# Patient Record
Sex: Female | Born: 1940 | Race: White | Hispanic: No | Smoking: Former smoker
Health system: Southern US, Community
[De-identification: ages and names within clinical notes are randomized; demographics above are authoritative.]

## PROBLEM LIST (undated history)

## (undated) DIAGNOSIS — K635 Polyp of colon: Secondary | ICD-10-CM

## (undated) DIAGNOSIS — K219 Gastro-esophageal reflux disease without esophagitis: Secondary | ICD-10-CM

## (undated) DIAGNOSIS — M1990 Unspecified osteoarthritis, unspecified site: Secondary | ICD-10-CM

## (undated) DIAGNOSIS — K222 Esophageal obstruction: Secondary | ICD-10-CM

## (undated) DIAGNOSIS — I10 Essential (primary) hypertension: Secondary | ICD-10-CM

## (undated) DIAGNOSIS — K5790 Diverticulosis of intestine, part unspecified, without perforation or abscess without bleeding: Secondary | ICD-10-CM

## (undated) DIAGNOSIS — K759 Inflammatory liver disease, unspecified: Secondary | ICD-10-CM

## (undated) DIAGNOSIS — E7800 Pure hypercholesterolemia, unspecified: Secondary | ICD-10-CM

## (undated) HISTORY — PX: ABDOMINAL HYSTERECTOMY: SHX81

## (undated) HISTORY — PX: CATARACT EXTRACTION: SUR2

---

## 2004-10-31 ENCOUNTER — Ambulatory Visit: Payer: Self-pay | Admitting: Family Medicine

## 2005-04-14 ENCOUNTER — Ambulatory Visit: Payer: Self-pay | Admitting: Family Medicine

## 2006-04-23 ENCOUNTER — Ambulatory Visit: Payer: Self-pay | Admitting: Family Medicine

## 2007-06-23 ENCOUNTER — Ambulatory Visit: Payer: Self-pay | Admitting: Family Medicine

## 2008-02-16 ENCOUNTER — Ambulatory Visit: Payer: Self-pay | Admitting: Gastroenterology

## 2008-07-18 ENCOUNTER — Ambulatory Visit: Payer: Self-pay | Admitting: Family Medicine

## 2009-07-24 ENCOUNTER — Ambulatory Visit: Payer: Self-pay | Admitting: Family Medicine

## 2009-08-14 ENCOUNTER — Ambulatory Visit: Payer: Self-pay | Admitting: Family Medicine

## 2009-10-22 ENCOUNTER — Ambulatory Visit: Payer: Self-pay | Admitting: Gastroenterology

## 2010-02-19 ENCOUNTER — Ambulatory Visit: Payer: Self-pay | Admitting: Family Medicine

## 2011-01-27 ENCOUNTER — Ambulatory Visit: Payer: Self-pay | Admitting: Family Medicine

## 2012-05-03 ENCOUNTER — Ambulatory Visit: Payer: Self-pay | Admitting: Family Medicine

## 2013-09-11 ENCOUNTER — Ambulatory Visit: Payer: Self-pay | Admitting: Family Medicine

## 2014-09-26 ENCOUNTER — Ambulatory Visit: Payer: Self-pay | Admitting: Family Medicine

## 2014-10-19 ENCOUNTER — Ambulatory Visit: Payer: Self-pay | Admitting: Gastroenterology

## 2015-06-04 ENCOUNTER — Other Ambulatory Visit: Payer: Self-pay | Admitting: Family Medicine

## 2015-06-04 DIAGNOSIS — Z1231 Encounter for screening mammogram for malignant neoplasm of breast: Secondary | ICD-10-CM

## 2015-10-10 ENCOUNTER — Ambulatory Visit
Admission: RE | Admit: 2015-10-10 | Discharge: 2015-10-10 | Disposition: A | Discharge status: Home / Self Care | Payer: Medicare Other | Source: Ambulatory Visit | Attending: Family Medicine | Admitting: Family Medicine

## 2015-10-10 ENCOUNTER — Other Ambulatory Visit: Payer: Self-pay | Admitting: Family Medicine

## 2015-10-10 DIAGNOSIS — Z1231 Encounter for screening mammogram for malignant neoplasm of breast: Secondary | ICD-10-CM | POA: Diagnosis present

## 2015-11-27 ENCOUNTER — Other Ambulatory Visit: Payer: Self-pay | Admitting: Surgery

## 2015-11-27 DIAGNOSIS — M1712 Unilateral primary osteoarthritis, left knee: Secondary | ICD-10-CM

## 2015-12-20 ENCOUNTER — Ambulatory Visit
Admission: RE | Admit: 2015-12-20 | Discharge: 2015-12-20 | Disposition: A | Discharge status: Home / Self Care | Payer: Medicare Other | Source: Ambulatory Visit | Attending: Surgery | Admitting: Surgery

## 2015-12-20 DIAGNOSIS — M1712 Unilateral primary osteoarthritis, left knee: Secondary | ICD-10-CM

## 2015-12-20 DIAGNOSIS — M25462 Effusion, left knee: Secondary | ICD-10-CM | POA: Insufficient documentation

## 2015-12-20 DIAGNOSIS — M659 Synovitis and tenosynovitis, unspecified: Secondary | ICD-10-CM | POA: Diagnosis not present

## 2016-01-15 ENCOUNTER — Ambulatory Visit
Admission: RE | Admit: 2016-01-15 | Discharge: 2016-01-15 | Disposition: A | Discharge status: Home / Self Care | Payer: Medicare Other | Source: Ambulatory Visit | Attending: Surgery | Admitting: Surgery

## 2016-01-15 ENCOUNTER — Encounter
Admission: RE | Admit: 2016-01-15 | Discharge: 2016-01-15 | Disposition: A | Discharge status: Home / Self Care | Payer: Medicare Other | Source: Ambulatory Visit | Attending: Surgery | Admitting: Surgery

## 2016-01-15 ENCOUNTER — Other Ambulatory Visit: Payer: Medicare Other

## 2016-01-15 DIAGNOSIS — I10 Essential (primary) hypertension: Secondary | ICD-10-CM | POA: Insufficient documentation

## 2016-01-15 DIAGNOSIS — Z87891 Personal history of nicotine dependence: Secondary | ICD-10-CM | POA: Diagnosis not present

## 2016-01-15 DIAGNOSIS — R918 Other nonspecific abnormal finding of lung field: Secondary | ICD-10-CM | POA: Insufficient documentation

## 2016-01-15 DIAGNOSIS — Z01818 Encounter for other preprocedural examination: Secondary | ICD-10-CM | POA: Insufficient documentation

## 2016-01-15 HISTORY — DX: Gastro-esophageal reflux disease without esophagitis: K21.9

## 2016-01-15 HISTORY — DX: Essential (primary) hypertension: I10

## 2016-01-15 HISTORY — DX: Pure hypercholesterolemia, unspecified: E78.00

## 2016-01-15 HISTORY — DX: Unspecified osteoarthritis, unspecified site: M19.90

## 2016-01-15 HISTORY — DX: Inflammatory liver disease, unspecified: K75.9

## 2016-01-15 LAB — CBC
HCT: 42 % (ref 35.0–47.0)
Hemoglobin: 14.1 g/dL (ref 12.0–16.0)
MCH: 31.6 pg (ref 26.0–34.0)
MCHC: 33.6 g/dL (ref 32.0–36.0)
MCV: 94 fL (ref 80.0–100.0)
PLATELETS: 180 10*3/uL (ref 150–440)
RBC: 4.47 MIL/uL (ref 3.80–5.20)
RDW: 12.8 % (ref 11.5–14.5)
WBC: 5.4 10*3/uL (ref 3.6–11.0)

## 2016-01-15 LAB — URINALYSIS COMPLETE WITH MICROSCOPIC (ARMC ONLY)
BACTERIA UA: NONE SEEN
Bilirubin Urine: NEGATIVE
GLUCOSE, UA: NEGATIVE mg/dL
HGB URINE DIPSTICK: NEGATIVE
KETONES UR: NEGATIVE mg/dL
NITRITE: NEGATIVE
PROTEIN: NEGATIVE mg/dL
SPECIFIC GRAVITY, URINE: 1.015 (ref 1.005–1.030)
pH: 5 (ref 5.0–8.0)

## 2016-01-15 LAB — BASIC METABOLIC PANEL
Anion gap: 7 (ref 5–15)
BUN: 16 mg/dL (ref 6–20)
CO2: 32 mmol/L (ref 22–32)
CREATININE: 0.72 mg/dL (ref 0.44–1.00)
Calcium: 9.7 mg/dL (ref 8.9–10.3)
Chloride: 101 mmol/L (ref 101–111)
GFR calc Af Amer: >60 mL/min (ref >60)
GLUCOSE: 94 mg/dL (ref 65–99)
Potassium: 3.1 mmol/L — ABNORMAL LOW (ref 3.5–5.1)
SODIUM: 140 mmol/L (ref 135–145)

## 2016-01-15 LAB — TYPE AND SCREEN
ABO/RH(D): A POS
ANTIBODY SCREEN: NEGATIVE

## 2016-01-15 LAB — SURGICAL PCR SCREEN
MRSA, PCR: NEGATIVE
Staphylococcus aureus: NEGATIVE

## 2016-01-15 LAB — ABO/RH: ABO/RH(D): A POS

## 2016-01-15 LAB — PROTIME-INR
INR: 0.93
PROTHROMBIN TIME: 12.7 s (ref 11.4–15.0)

## 2016-01-15 NOTE — Patient Instructions (Addendum)
  Your procedure is scheduled on: January 23, 2016 (Thursday)  Report to Day Surgery.[facility]) Second Floor To find out your arrival time please call [phone] between 1PM - 3PM on January 22, 2016 (Wednesday).  Remember: Instructions that are not followed completely may result in serious medical risk, up to and including death, or upon the discretion of your surgeon and anesthesiologist your surgery may need to be rescheduled.    __x__ 1. Do not eat food or drink liquids after midnight. No gum chewing or hard candies.     ____ 2. No Alcohol for 24 hours before or after surgery.   ____ 3. Bring all medications with you on the day of surgery if instructed.    __x__ 4. Notify your doctor if there is any change in your medical condition     (cold, fever, infections).     Do not wear jewelry, make-up, hairpins, clips or nail polish.  Do not wear lotions, powders, or perfumes. You may wear deodorant.  Do not shave 48 hours prior to surgery. Men may shave face and neck.  Do not bring valuables to the hospital.    [facility] is not responsible for any belongings or valuables.               Contacts, dentures or bridgework may not be worn into surgery.  Leave your suitcase in the car. After surgery it may be brought to your room.  For patients admitted to the hospital, discharge time is determined by your                treatment team.   Patients discharged the day of surgery will not be allowed to drive home.   Please read over the following fact sheets that you were given:   MRSA Information and Surgical Site Infection Prevention   ____ Take these medicines the morning of surgery with A SIP OF WATER:    1. Atenolol  2. Amlodipine  3. Benazepril  4.Simvastatin  5.Pantoprazole (Pantoprazole at bedtime on January 25)  6.  ____ Fleet Enema (as directed)   __x__ Use CHG Soap as directed  ____ Use inhalers on the day of surgery  ____ Stop metformin 2 days prior to  surgery    ____ Take 1/2 of usual insulin dose the night before surgery and none on the morning of surgery.   _x___ Stop Coumadin/Plavix/aspirin on (STOP ASPIRIN ONE WEEK PRIOR TO SURGERY)  _x___ Stop Anti-inflammatories on (NO NSAIDS, TYLENOL OK TO TAKE FOR PAIN IF NEEDED)   _X___ Stop supplements until after surgery. (STOP MULTIVITAMIN NOW)  ____ Bring C-Pap to the hospital.

## 2016-01-15 NOTE — Pre-Procedure Instructions (Signed)
Called Dr Marcello Moores with EKG interpretation." Sounds OK to proceed."

## 2016-01-16 NOTE — Pre-Procedure Instructions (Signed)
Potassium and Ua results , CXR, called and faxed to [facility] @ Dr. Roland Rack office.

## 2016-01-23 ENCOUNTER — Inpatient Hospital Stay
Admission: RE | Admit: 2016-01-23 | Discharge: 2016-01-25 | DRG: 470 | Disposition: A | Discharge status: Home / Self Care | Payer: Medicare Other | Source: Ambulatory Visit | Attending: Surgery | Admitting: Surgery

## 2016-01-23 ENCOUNTER — Ambulatory Visit: Payer: Medicare Other | Admitting: Anesthesiology

## 2016-01-23 ENCOUNTER — Encounter
Admission: RE | Disposition: A | Discharge status: Home / Self Care | Payer: Self-pay | Source: Ambulatory Visit | Attending: Surgery

## 2016-01-23 ENCOUNTER — Inpatient Hospital Stay: Payer: Medicare Other

## 2016-01-23 DIAGNOSIS — I10 Essential (primary) hypertension: Secondary | ICD-10-CM | POA: Diagnosis present

## 2016-01-23 DIAGNOSIS — E7800 Pure hypercholesterolemia, unspecified: Secondary | ICD-10-CM | POA: Diagnosis present

## 2016-01-23 DIAGNOSIS — M1712 Unilateral primary osteoarthritis, left knee: Principal | ICD-10-CM | POA: Diagnosis present

## 2016-01-23 DIAGNOSIS — Z7982 Long term (current) use of aspirin: Secondary | ICD-10-CM | POA: Diagnosis not present

## 2016-01-23 DIAGNOSIS — M25762 Osteophyte, left knee: Secondary | ICD-10-CM | POA: Diagnosis present

## 2016-01-23 DIAGNOSIS — Z87891 Personal history of nicotine dependence: Secondary | ICD-10-CM

## 2016-01-23 DIAGNOSIS — K219 Gastro-esophageal reflux disease without esophagitis: Secondary | ICD-10-CM | POA: Diagnosis present

## 2016-01-23 DIAGNOSIS — Z96652 Presence of left artificial knee joint: Secondary | ICD-10-CM

## 2016-01-23 HISTORY — PX: PARTIAL KNEE ARTHROPLASTY: SHX2174

## 2016-01-23 LAB — POTASSIUM: Potassium: 4.3 mmol/L (ref 3.5–5.1)

## 2016-01-23 LAB — POCT I-STAT 4, (NA,K, GLUC, HGB,HCT)
Glucose, Bld: 98 mg/dL (ref 65–99)
HCT: 46 % (ref 36.0–46.0)
HEMOGLOBIN: 15.6 g/dL — AB (ref 12.0–15.0)
Potassium: 4.7 mmol/L (ref 3.5–5.1)
Sodium: 140 mmol/L (ref 135–145)

## 2016-01-23 SURGERY — ARTHROPLASTY, KNEE, UNICOMPARTMENTAL
Anesthesia: General | Site: Knee | Laterality: Left | Wound class: Clean

## 2016-01-23 MED ORDER — SODIUM CHLORIDE 0.9 % IJ SOLN
INTRAMUSCULAR | Status: AC
  Filled 2016-01-23: qty 50

## 2016-01-23 MED ORDER — FENTANYL CITRATE (PF) 100 MCG/2ML IJ SOLN: 25.0000 ug | INTRAMUSCULAR | Status: DC | PRN

## 2016-01-23 MED ORDER — KETAMINE HCL 50 MG/ML IJ SOLN
INTRAMUSCULAR | Status: DC | PRN
  Administered 2016-01-23: 1.15 mg via INTRAMUSCULAR
  Administered 2016-01-23: 2.3 mg via INTRAMUSCULAR

## 2016-01-23 MED ORDER — SIMVASTATIN 20 MG PO TABS
20.0000 mg | ORAL_TABLET | Freq: Every day | ORAL | Status: DC
  Administered 2016-01-24 – 2016-01-25 (×2): 20 mg via ORAL
  Filled 2016-01-23 (×2): qty 1

## 2016-01-23 MED ORDER — ACETAMINOPHEN 325 MG PO TABS
650.0000 mg | ORAL_TABLET | Freq: Four times a day (QID) | ORAL | Status: DC | PRN
  Administered 2016-01-24: 650 mg via ORAL
  Filled 2016-01-23: qty 2

## 2016-01-23 MED ORDER — DIPHENHYDRAMINE HCL 12.5 MG/5ML PO ELIX
12.5000 mg | ORAL_SOLUTION | ORAL | Status: DC | PRN
  Administered 2016-01-23: 25 mg via ORAL
  Filled 2016-01-23: qty 10

## 2016-01-23 MED ORDER — ACETAMINOPHEN 650 MG RE SUPP: 650.0000 mg | Freq: Four times a day (QID) | RECTAL | Status: DC | PRN

## 2016-01-23 MED ORDER — MAGNESIUM HYDROXIDE 400 MG/5ML PO SUSP
30.0000 mL | Freq: Every day | ORAL | Status: DC | PRN
  Administered 2016-01-24 – 2016-01-25 (×2): 30 mL via ORAL
  Filled 2016-01-23 (×2): qty 30

## 2016-01-23 MED ORDER — GLYCOPYRROLATE 0.2 MG/ML IJ SOLN
INTRAMUSCULAR | Status: DC | PRN
  Administered 2016-01-23: 0.2 mg via INTRAVENOUS

## 2016-01-23 MED ORDER — BISACODYL 10 MG RE SUPP
10.0000 mg | Freq: Every day | RECTAL | Status: DC | PRN
  Administered 2016-01-25: 10 mg via RECTAL
  Filled 2016-01-23: qty 1

## 2016-01-23 MED ORDER — CALCIUM CARBONATE-VITAMIN D 500-200 MG-UNIT PO TABS
2.0000 | ORAL_TABLET | Freq: Every day | ORAL | Status: DC
  Administered 2016-01-24 – 2016-01-25 (×2): 2 via ORAL
  Filled 2016-01-23 (×2): qty 2

## 2016-01-23 MED ORDER — KETAMINE HCL 100 MG/ML IJ SOLN
250.0000 mg | INTRAMUSCULAR | Status: DC | PRN
  Administered 2016-01-23: 4.5 ug/kg/min via INTRAVENOUS
  Administered 2016-01-23: 6.5 ug/kg/min via INTRAVENOUS

## 2016-01-23 MED ORDER — AMLODIPINE BESYLATE 10 MG PO TABS
10.0000 mg | ORAL_TABLET | Freq: Every day | ORAL | Status: DC
  Administered 2016-01-24 – 2016-01-25 (×2): 10 mg via ORAL
  Filled 2016-01-23 (×2): qty 1

## 2016-01-23 MED ORDER — ONDANSETRON HCL 4 MG/2ML IJ SOLN
4.0000 mg | Freq: Four times a day (QID) | INTRAMUSCULAR | Status: DC | PRN
  Administered 2016-01-23 – 2016-01-24 (×2): 4 mg via INTRAVENOUS
  Filled 2016-01-23 (×2): qty 2

## 2016-01-23 MED ORDER — METOCLOPRAMIDE HCL 5 MG PO TABS: 5.0000 mg | ORAL_TABLET | Freq: Three times a day (TID) | ORAL | Status: DC | PRN

## 2016-01-23 MED ORDER — ONDANSETRON HCL 4 MG/2ML IJ SOLN: 4.0000 mg | Freq: Once | INTRAMUSCULAR | Status: DC | PRN

## 2016-01-23 MED ORDER — ACETAMINOPHEN 500 MG PO TABS
1000.0000 mg | ORAL_TABLET | Freq: Four times a day (QID) | ORAL | Status: AC
  Administered 2016-01-23 – 2016-01-24 (×3): 1000 mg via ORAL
  Filled 2016-01-23 (×4): qty 2

## 2016-01-23 MED ORDER — TRANEXAMIC ACID 1000 MG/10ML IV SOLN
INTRAVENOUS | Status: AC
  Filled 2016-01-23: qty 10

## 2016-01-23 MED ORDER — BUPIVACAINE LIPOSOME 1.3 % IJ SUSP
INTRAMUSCULAR | Status: DC | PRN
  Administered 2016-01-23: 60 mL

## 2016-01-23 MED ORDER — CALCIUM CARB-CHOLECALCIFEROL 600-800 MG-UNIT PO TABS: 2.0000 | ORAL_TABLET | Freq: Every day | ORAL | Status: DC

## 2016-01-23 MED ORDER — FLEET ENEMA 7-19 GM/118ML RE ENEM: 1.0000 | ENEMA | Freq: Once | RECTAL | Status: DC | PRN

## 2016-01-23 MED ORDER — ONDANSETRON HCL 4 MG PO TABS: 4.0000 mg | ORAL_TABLET | Freq: Four times a day (QID) | ORAL | Status: DC | PRN

## 2016-01-23 MED ORDER — CEFAZOLIN SODIUM-DEXTROSE 2-3 GM-% IV SOLR
2.0000 g | Freq: Once | INTRAVENOUS | Status: AC
  Administered 2016-01-23: 2 g via INTRAVENOUS

## 2016-01-23 MED ORDER — PHENYLEPHRINE HCL 10 MG/ML IJ SOLN
INTRAMUSCULAR | Status: DC | PRN
  Administered 2016-01-23 (×4): 100 ug via INTRAVENOUS

## 2016-01-23 MED ORDER — BUPIVACAINE-EPINEPHRINE (PF) 0.5% -1:200000 IJ SOLN
INTRAMUSCULAR | Status: AC
  Filled 2016-01-23: qty 30

## 2016-01-23 MED ORDER — EPHEDRINE SULFATE 50 MG/ML IJ SOLN
INTRAMUSCULAR | Status: DC | PRN
  Administered 2016-01-23: 10 mg via INTRAVENOUS

## 2016-01-23 MED ORDER — ASPIRIN EC 81 MG PO TBEC
162.0000 mg | DELAYED_RELEASE_TABLET | Freq: Every day | ORAL | Status: DC
  Administered 2016-01-24 – 2016-01-25 (×2): 162 mg via ORAL
  Filled 2016-01-23 (×2): qty 2

## 2016-01-23 MED ORDER — CEFAZOLIN SODIUM-DEXTROSE 2-3 GM-% IV SOLR
INTRAVENOUS | Status: AC
  Filled 2016-01-23: qty 50

## 2016-01-23 MED ORDER — NEOMYCIN-POLYMYXIN B GU 40-200000 IR SOLN
Status: DC | PRN
  Administered 2016-01-23: 16 mL

## 2016-01-23 MED ORDER — SULFAMETHOXAZOLE-TRIMETHOPRIM 800-160 MG PO TABS
2.0000 | ORAL_TABLET | Freq: Two times a day (BID) | ORAL | Status: DC
  Administered 2016-01-23 – 2016-01-25 (×4): 2 via ORAL
  Filled 2016-01-23 (×4): qty 2

## 2016-01-23 MED ORDER — WOMENS MULTIVITAMIN PLUS PO TABS: 1.0000 | ORAL_TABLET | Freq: Every day | ORAL | Status: DC

## 2016-01-23 MED ORDER — FENTANYL CITRATE (PF) 100 MCG/2ML IJ SOLN
INTRAMUSCULAR | Status: DC | PRN
  Administered 2016-01-23: 100 ug via INTRAVENOUS

## 2016-01-23 MED ORDER — PANTOPRAZOLE SODIUM 40 MG PO TBEC
40.0000 mg | DELAYED_RELEASE_TABLET | Freq: Every day | ORAL | Status: DC
  Administered 2016-01-24 – 2016-01-25 (×2): 40 mg via ORAL
  Filled 2016-01-23 (×2): qty 1

## 2016-01-23 MED ORDER — PROPOFOL 500 MG/50ML IV EMUL
INTRAVENOUS | Status: DC | PRN
  Administered 2016-01-23: 45 ug/kg/min via INTRAVENOUS

## 2016-01-23 MED ORDER — ACETAMINOPHEN 10 MG/ML IV SOLN
INTRAVENOUS | Status: AC
  Filled 2016-01-23: qty 100

## 2016-01-23 MED ORDER — ACETAMINOPHEN 10 MG/ML IV SOLN
INTRAVENOUS | Status: DC | PRN
  Administered 2016-01-23: 1000 mg via INTRAVENOUS

## 2016-01-23 MED ORDER — LACTATED RINGERS IV SOLN
INTRAVENOUS | Status: DC
  Administered 2016-01-23: 07:00:00 via INTRAVENOUS

## 2016-01-23 MED ORDER — ADULT MULTIVITAMIN W/MINERALS CH
1.0000 | ORAL_TABLET | Freq: Every day | ORAL | Status: DC
  Administered 2016-01-24 – 2016-01-25 (×2): 1 via ORAL
  Filled 2016-01-23 (×2): qty 1

## 2016-01-23 MED ORDER — METOCLOPRAMIDE HCL 5 MG/ML IJ SOLN
5.0000 mg | Freq: Three times a day (TID) | INTRAMUSCULAR | Status: DC | PRN
  Administered 2016-01-24: 10 mg via INTRAVENOUS
  Filled 2016-01-23: qty 2

## 2016-01-23 MED ORDER — BENAZEPRIL HCL 20 MG PO TABS
40.0000 mg | ORAL_TABLET | Freq: Every day | ORAL | Status: DC
  Administered 2016-01-24 – 2016-01-25 (×2): 40 mg via ORAL
  Filled 2016-01-23 (×2): qty 2

## 2016-01-23 MED ORDER — NEOMYCIN-POLYMYXIN B GU 40-200000 IR SOLN
Status: AC
  Filled 2016-01-23: qty 20

## 2016-01-23 MED ORDER — TRANEXAMIC ACID 1000 MG/10ML IV SOLN
1000.0000 mg | INTRAVENOUS | Status: DC | PRN
  Administered 2016-01-23: 1000 mg via TOPICAL

## 2016-01-23 MED ORDER — BUPIVACAINE LIPOSOME 1.3 % IJ SUSP
INTRAMUSCULAR | Status: AC
  Filled 2016-01-23: qty 20

## 2016-01-23 MED ORDER — BUPIVACAINE-EPINEPHRINE (PF) 0.5% -1:200000 IJ SOLN
INTRAMUSCULAR | Status: DC | PRN
  Administered 2016-01-23: 30 mL via PERINEURAL

## 2016-01-23 MED ORDER — DOCUSATE SODIUM 100 MG PO CAPS
100.0000 mg | ORAL_CAPSULE | Freq: Two times a day (BID) | ORAL | Status: DC
  Administered 2016-01-23 – 2016-01-25 (×4): 100 mg via ORAL
  Filled 2016-01-23 (×4): qty 1

## 2016-01-23 MED ORDER — ENOXAPARIN SODIUM 40 MG/0.4ML ~~LOC~~ SOLN
40.0000 mg | SUBCUTANEOUS | Status: DC
  Administered 2016-01-24 – 2016-01-25 (×2): 40 mg via SUBCUTANEOUS
  Filled 2016-01-23 (×2): qty 0.4

## 2016-01-23 MED ORDER — HYDROMORPHONE HCL 1 MG/ML IJ SOLN: 0.5000 mg | INTRAMUSCULAR | Status: DC | PRN

## 2016-01-23 MED ORDER — PROPOFOL 10 MG/ML IV BOLUS
INTRAVENOUS | Status: DC | PRN
Start: 2016-01-23 — End: 2016-01-23
  Administered 2016-01-23: 23 mg via INTRAVENOUS
  Administered 2016-01-23: 11.5 mg via INTRAVENOUS

## 2016-01-23 MED ORDER — MIDAZOLAM HCL 5 MG/5ML IJ SOLN
INTRAMUSCULAR | Status: DC | PRN
  Administered 2016-01-23: 1 mg via INTRAVENOUS

## 2016-01-23 MED ORDER — SODIUM CHLORIDE 0.9 % IV SOLN
10000.0000 ug | INTRAVENOUS | Status: DC | PRN
  Administered 2016-01-23: 15 ug/min via INTRAVENOUS

## 2016-01-23 MED ORDER — ATENOLOL 25 MG PO TABS
12.5000 mg | ORAL_TABLET | Freq: Every day | ORAL | Status: DC
  Administered 2016-01-24 – 2016-01-25 (×2): 12.5 mg via ORAL
  Filled 2016-01-23 (×2): qty 1

## 2016-01-23 MED ORDER — CEFAZOLIN SODIUM-DEXTROSE 2-3 GM-% IV SOLR
2.0000 g | Freq: Four times a day (QID) | INTRAVENOUS | Status: AC
  Administered 2016-01-23 (×3): 2 g via INTRAVENOUS
  Filled 2016-01-23 (×3): qty 50

## 2016-01-23 MED ORDER — OXYCODONE HCL 5 MG PO TABS
5.0000 mg | ORAL_TABLET | ORAL | Status: DC | PRN
  Administered 2016-01-23: 10 mg via ORAL
  Administered 2016-01-23: 5 mg via ORAL
  Administered 2016-01-23: 10 mg via ORAL
  Administered 2016-01-23 – 2016-01-24 (×2): 5 mg via ORAL
  Administered 2016-01-24: 10 mg via ORAL
  Administered 2016-01-24 – 2016-01-25 (×6): 5 mg via ORAL
  Filled 2016-01-23 (×4): qty 1
  Filled 2016-01-23: qty 2
  Filled 2016-01-23 (×2): qty 1
  Filled 2016-01-23: qty 2
  Filled 2016-01-23: qty 1
  Filled 2016-01-23: qty 2
  Filled 2016-01-23 (×2): qty 1

## 2016-01-23 MED ORDER — KCL IN DEXTROSE-NACL 20-5-0.9 MEQ/L-%-% IV SOLN
INTRAVENOUS | Status: DC
  Administered 2016-01-23 (×2): via INTRAVENOUS
  Filled 2016-01-23 (×7): qty 1000

## 2016-01-23 SURGICAL SUPPLY — 76 items
BANDAGE ACE 6X5 VEL STRL LF (GAUZE/BANDAGES/DRESSINGS) ×3 IMPLANT
BLADE SURG SZ10 CARB STEEL (BLADE) ×12 IMPLANT
BNDG COHESIVE 4X5 TAN STRL (GAUZE/BANDAGES/DRESSINGS) ×3 IMPLANT
BNDG COHESIVE 6X5 TAN STRL LF (GAUZE/BANDAGES/DRESSINGS) ×3 IMPLANT
BNDG ESMARK 6X12 TAN STRL LF (GAUZE/BANDAGES/DRESSINGS) ×3 IMPLANT
BONE CEMENT PALACOSE (Orthopedic Implant) ×3 IMPLANT
BOWL CEMENT MIX W SPATULA BONE (MISCELLANEOUS) IMPLANT
CANISTER SUCT 1200ML W/VALVE (MISCELLANEOUS) ×3 IMPLANT
CANISTER SUCT 3000ML (MISCELLANEOUS) ×3 IMPLANT
CAPT KNEE PARTIAL 2 ×3 IMPLANT
CATH FOL LEG HOLDER (MISCELLANEOUS) ×3 IMPLANT
CATH TRAY METER 16FR LF (MISCELLANEOUS) ×3 IMPLANT
CEMENT BONE PALACOSE (Orthopedic Implant) ×1 IMPLANT
CHLORAPREP W/TINT 26ML (MISCELLANEOUS) ×6 IMPLANT
CNTNR SPEC C3OZ STD GRAD LEK (MISCELLANEOUS) ×1 IMPLANT
CONT SPEC 3OZ W/LID STRL (MISCELLANEOUS) ×2
COOLER POLAR GLACIER W/PUMP (MISCELLANEOUS) ×3 IMPLANT
COVER MAYO STAND STRL (DRAPES) ×3 IMPLANT
DRAPE C-ARM XRAY 36X54 (DRAPES) ×3 IMPLANT
DRAPE INCISE IOBAN 66X45 STRL (DRAPES) ×3 IMPLANT
DRAPE POUCH INSTRU U-SHP 10X18 (DRAPES) ×3 IMPLANT
DRAPE TABLE BACK 80X90 (DRAPES) ×3 IMPLANT
DRSG OPSITE POSTOP 4X12 (GAUZE/BANDAGES/DRESSINGS) ×3 IMPLANT
DRSG OPSITE POSTOP 4X14 (GAUZE/BANDAGES/DRESSINGS) ×3 IMPLANT
DRSG OPSITE POSTOP 4X6 (GAUZE/BANDAGES/DRESSINGS) ×3 IMPLANT
ELECT CAUTERY BLADE 6.4 (BLADE) ×3 IMPLANT
ELECT REM PT RETURN 9FT ADLT (ELECTROSURGICAL) ×3
ELECTRODE REM PT RTRN 9FT ADLT (ELECTROSURGICAL) ×1 IMPLANT
GAUZE PETRO XEROFOAM 1X8 (MISCELLANEOUS) ×3 IMPLANT
GAUZE SPONGE 4X4 12PLY STRL (GAUZE/BANDAGES/DRESSINGS) IMPLANT
GLOVE BIO SURGEON STRL SZ7.5 (GLOVE) ×6 IMPLANT
GLOVE BIO SURGEON STRL SZ8 (GLOVE) ×6 IMPLANT
GLOVE BIOGEL PI IND STRL 8 (GLOVE) ×1 IMPLANT
GLOVE BIOGEL PI INDICATOR 8 (GLOVE) ×2
GLOVE INDICATOR 8.0 STRL GRN (GLOVE) ×3 IMPLANT
GOWN STRL REUS W/ TWL LRG LVL3 (GOWN DISPOSABLE) ×2 IMPLANT
GOWN STRL REUS W/ TWL XL LVL3 (GOWN DISPOSABLE) ×1 IMPLANT
GOWN STRL REUS W/TWL LRG LVL3 (GOWN DISPOSABLE) ×4
GOWN STRL REUS W/TWL XL LVL3 (GOWN DISPOSABLE) ×2
GRADUATE 1200CC STRL 31836 (MISCELLANEOUS) ×3 IMPLANT
HANDLE YANKAUER SUCT BULB TIP (MISCELLANEOUS) ×3 IMPLANT
HANDPIECE SUCTION TUBG SURGILV (MISCELLANEOUS) ×3 IMPLANT
HOOD PEEL AWAY FLYTE STAYCOOL (MISCELLANEOUS) ×12 IMPLANT
MAT BLUE FLOOR 46X72 FLO (MISCELLANEOUS) ×3 IMPLANT
NDL SAFETY 18GX1.5 (NEEDLE) ×3 IMPLANT
NEEDLE SPNL 18GX3.5 QUINCKE PK (NEEDLE) ×3 IMPLANT
NEEDLE SPNL 20GX3.5 QUINCKE YW (NEEDLE) ×3 IMPLANT
NS IRRIG 1000ML POUR BTL (IV SOLUTION) ×3 IMPLANT
PACK ARTHROSCOPY KNEE (MISCELLANEOUS) ×3 IMPLANT
PACK BLADE SAW RECIP 70 3 PT (BLADE) ×3 IMPLANT
PAD WRAPON POLAR KNEE (MISCELLANEOUS) ×1 IMPLANT
PADDING CAST 4IN STRL (MISCELLANEOUS) ×4
PADDING CAST BLEND 4X4 STRL (MISCELLANEOUS) ×2 IMPLANT
PENCIL ELECTRO HAND CTR (MISCELLANEOUS) ×3 IMPLANT
SOL .9 NS 3000ML IRR  AL (IV SOLUTION) ×2
SOL .9 NS 3000ML IRR UROMATIC (IV SOLUTION) ×1 IMPLANT
SPONGE LAP 18X18 5 PK (GAUZE/BANDAGES/DRESSINGS) IMPLANT
SPONGE XRAY 4X4 16PLY STRL (MISCELLANEOUS) ×3 IMPLANT
STAPLER SKIN PROX 35W (STAPLE) ×3 IMPLANT
STRAP SAFETY BODY (MISCELLANEOUS) ×3 IMPLANT
SUCTION FRAZIER HANDLE 10FR (MISCELLANEOUS) ×2
SUCTION TUBE FRAZIER 10FR DISP (MISCELLANEOUS) ×1 IMPLANT
SUT VIC AB 0 CT1 36 (SUTURE) IMPLANT
SUT VIC AB 2-0 CT1 27 (SUTURE) ×8
SUT VIC AB 2-0 CT1 TAPERPNT 27 (SUTURE) ×4 IMPLANT
SUT VIC AB 2-0 CT2 27 (SUTURE) IMPLANT
SYR 20CC LL (SYRINGE) ×3 IMPLANT
SYR 30ML LL (SYRINGE) ×6 IMPLANT
SYR BULB IRRIG 60ML STRL (SYRINGE) ×3 IMPLANT
SYRINGE 10CC LL (SYRINGE) ×3 IMPLANT
TAPE TRANSPORE STRL 2 31045 (GAUZE/BANDAGES/DRESSINGS) ×3 IMPLANT
TUBING CONNECTING 10 (TUBING) ×2 IMPLANT
TUBING CONNECTING 10' (TUBING) ×1
WATER STERILE IRR 1000ML POUR (IV SOLUTION) ×3 IMPLANT
WRAPON POLAR PAD KNEE (MISCELLANEOUS) ×3
ZIMMER COMPACT MIXING SYSTEM ×3 IMPLANT

## 2016-01-23 NOTE — Op Note (Signed)
01/23/2016  9:39 AM  Patient:   Marissa Mullen  Pre-Op Diagnosis:   Osteoarthritis of medial compartment, left knee.  Post-Op Diagnosis:   Same  Procedure:   Left unicondylar knee arthroplasty.  Surgeon:   Pascal Lux, MD  Assistant:   Cameron Proud, PA-C; Amber Race, PA-S  Anesthesia:   Spinal  Findings:   As above.  Complications:   None  EBL:   10 cc  Fluids:   850 cc crystalloid  UOP:   275 cc  TT:   75 minutes at 300 mmHg  Drains:   None  Closure:   Staples  Implants:   All-cemented Biomet Oxford system with a Small femoral component, a "B" sized tibial tray, and a 5 mm meniscal bearing insert.  Brief Clinical Note:   The patient is a 75 year old female with a history of gradually worsening medial sided left knee pain. Her symptoms have progressed despite medications, activity modification, etc. Her history and examination are consistent with degenerative joint disease, primarily involving the medial compartment. The patient presents at this time for a left partial knee replacement.  Procedure:   The patient was brought into the operating room and a spinal placed by the anesthesiologist. The patient was lain in the supine position and a Foley catheter inserted. The patient was repositioned so that the non-surgical leg was placed in a flexed and abducted position in the yellow fin leg holder while the surgical extremity was placed over the Biomet leg holder. The left lower extremity was prepped with ChloraPrep solution before being draped sterilely. Preoperative antibiotics were administered. After performing a timeout to verify the appropriate surgical site, the limb was exsanguinated with an Esmarch and the tourniquet inflated to 300 mmHg. A standard anterior approach to the knee was made through an approximately 3-3.5 inch incision. The incision was carried down through the subcutaneous tissues to expose the superficial retinaculum. This was split the length the  incision and medial flap elevated sufficiently to expose the medial retinaculum. This was incised along the medial border of the patella tendon and extended proximally along the medial border of the patella, leaving a 3-4 mm cuff of tissue. The soft tissues were elevated off the anteromedial aspect of the proximal tibia. The anterior portion of the meniscus was removed after performing a subtotal excision of the infrapatellar fat pad. The anterior cruciate ligament was inspected and found to be in excellent condition. Osteophytes were removed from the inferior pole of the patella as well as from the notch using a quarter-inch osteotome. There were significant degenerative changes of both the femur and tibia on the medial side. The medial femoral condyle was sized using the small and medium sizers. It was felt that the Small guide best optimized the contour of the femur. This was left in place and the external tibial guide positioned. The 3 mm coupling device was used to connect the guide to the medial femoral condylar sizer to optimize appropriate orientation. Two guide pins were inserted into the cutting block before the coupling device and sizer were removed. The appropriate tibial cut was made using the oscillating and reciprocating saws. The piece was removed in its entirety and taken to the back table where it was sized and found to be optimally replicated by a "B" sized component. The 9 mm spacer was inserted to verify that sufficient bone had been removed.  Attention was directed to femoral side. The intramedullary canal was accessed through a 4 mm drill hole.  The intramedullary guide was positioned before the guide for the femoral condylar holes was positioned. The appropriate coupling device connected this guide to the intramedullary guide before both drill holes were created in the distal aspect of the medial femoral condyle. The devices were removed and the posterior condylar cutting block inserted. The  appropriate cut was made using the reciprocating saw and this piece removed. The #0 spigot was inserted and the initial bone milling performed. A trial femoral component was inserted and both the flexion and extension gaps measured. In flexion, the gap measured 8 mm whereas in extension, it measured 4 mm. Therefore, the #4 spigot was selected and the secondary bone milling performed. Repeat sizing demonstrated symmetric flexion and extension gaps. The bone was removed from the postero-medial and postero-lateral aspects of the femoral condyle, as well as from the beneath the collar of the spigot. Bone also was removed from the anterior portion of the femur so as to minimize any potential impingement with the meniscal bearing insert. The trial components removed and several drill holes placed into the distal femoral condyle to further augment cement fixation.  Attention was redirected to the tibial side. The "B" sized tibial tray was positioned and temporarily secured using the appropriate spiked nail. The keel was created using the bi-bladed reciprocating saw and hoe. The keeled "B" sized trial tibial tray was inserted to be sure that it seated properly. At this point, a total of 20 cc of Exparel diluted out to 60 cc with normal saline and 30 cc of 0.5% Sensorcaine was injected in and around the posterior and medial capsular tissues, as well as the peri-incisional tissues to help with postoperative pain control.  The bony surfaces were prepared for cementing by irrigating them thoroughly with bacitracin saline solution using the jet lavage system before packing them with a dry Ray-Tec sponge. Meanwhile, cement was being mixed on the back table. When the cement was ready, the tibial tray was cemented in first. The excess cement was removed using a Surveyor, quantity after impacting it into place. Next, the femoral component was impacted into place. Again the excess cement was removed using a Surveyor, quantity. The 6  mm spacer was inserted and the knee brought into near full extension while the cement hardened. Once the cement hardened, the spacer was removed and the 5 mm meniscal bearing insert was trialed. This demonstrated excellent tracking while the knee was placed through a range of motion, and showed no evidence towards subluxation or dislocation. In addition, it did not fit too tightly. Therefore, the permanent 5 mm meniscal bearing insert was snapped into position after verifying that no cement in the retained posteriorly. Again the knee was placed through a range of motion with the findings as described above.  The wound was copiously irrigated with bacitracin saline solution via the jet lavage system before the retinacular layer was reapproximated using #0 Vicryl interrupted sutures. At this point, 1 g of transexemic acid in 10 cc of normal saline was injected intra-articularly. The subcutaneous tissues were closed in two layers using 2-0 Vicryl interrupted sutures before the skin was closed using staples. A sterile occlusive dressing was applied to the knee before the patient was awakened. The patient was transferred back to her hospital bed and returned to the recovery room in satisfactory condition after tolerating the procedure well. A Polar Care device was applied to the knee as well.

## 2016-01-23 NOTE — NC FL2 (Signed)
[place] LEVEL OF CARE SCREENING TOOL     IDENTIFICATION  Patient Name: Marissa Mullen Birthdate: [birth year] Sex: female Admission Date (Current Location): 01/23/2016  [place] and [place] Number:  Engineering geologist and Address:  [facility], [address] [place], [place] [address]      Provider Number: [id number]  Attending Physician Name and Address:  Corky Mull, MD  Relative Name and Phone Number:       Current Level of Care: Hospital Recommended Level of Care: [place] Prior Approval Number:    Date Approved/Denied:   PASRR Number:  ([id number])  Discharge Plan: SNF    Current Diagnoses: Patient Active Problem List   Diagnosis Date Noted  . 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  BEHAVIORAL SYMPTOMS/MOOD NEUROLOGICAL BOWEL NUTRITION STATUS   (none )  (none) Continent Diet (Diet: Clear Liquid )  AMBULATORY STATUS COMMUNICATION OF NEEDS Skin   Extensive Assist Verbally Surgical wounds (Incision: Left Knee )                       Personal Care Assistance Level of Assistance  Bathing, Feeding, Dressing Bathing Assistance: Limited assistance Feeding assistance: Independent Dressing Assistance: Limited assistance     Functional Limitations Info  Sight, Hearing, Speech Sight Info: Adequate Hearing Info: Adequate Speech Info: Adequate    SPECIAL CARE FACTORS FREQUENCY  PT (By licensed PT), OT (By licensed OT)     PT Frequency:  (5) OT Frequency:  (5)            Contractures      Additional Factors Info  Code Status Code Status Info:  (Full Code. )             Current Medications (01/23/2016):  This is the current hospital active medication list Current Facility-Administered Medications  Medication Dose Route  Frequency Provider Last Rate Last Dose  . acetaminophen (TYLENOL) tablet 650 mg  650 mg Oral Q6H PRN Corky Mull, MD       Or  . acetaminophen (TYLENOL) suppository 650 mg  650 mg Rectal Q6H PRN Corky Mull, MD      . acetaminophen (TYLENOL) tablet 1,000 mg  1,000 mg Oral 4 times per day Corky Mull, MD      . amLODipine (NORVASC) tablet 10 mg  10 mg Oral Daily Corky Mull, MD      . aspirin EC tablet 162 mg  162 mg Oral Daily Corky Mull, MD      . atenolol (TENORMIN) tablet 12.5 mg  12.5 mg Oral Daily Corky Mull, MD      . benazepril (LOTENSIN) tablet 40 mg  40 mg Oral Daily Corky Mull, MD      . bisacodyl (DULCOLAX) suppository 10 mg  10 mg Rectal Daily PRN Corky Mull, MD      . Derrill Memo ON 01/24/2016] calcium-vitamin D (OSCAL WITH D) 500-200 MG-UNIT per tablet 2 tablet  2 tablet Oral Q breakfast Vishal Mungal, MD      . ceFAZolin (ANCEF) 2-3 GM-% IVPB SOLR           . ceFAZolin (ANCEF) IVPB 2 g/50 mL premix  2 g Intravenous Q6H Corky Mull, MD      .  dextrose 5 % and 0.9 % NaCl with KCl 20 mEq/L infusion   Intravenous Continuous Corky Mull, MD      . diphenhydrAMINE (BENADRYL) 12.5 MG/5ML elixir 12.5-25 mg  12.5-25 mg Oral Q4H PRN Corky Mull, MD      . docusate sodium (COLACE) capsule 100 mg  100 mg Oral BID Corky Mull, MD      . Derrill Memo ON 01/24/2016] enoxaparin (LOVENOX) injection 40 mg  40 mg Subcutaneous Q24H Corky Mull, MD      . HYDROmorphone (DILAUDID) injection 0.5-1 mg  0.5-1 mg Intravenous Q2H PRN Corky Mull, MD      . magnesium hydroxide (MILK OF MAGNESIA) suspension 30 mL  30 mL Oral Daily PRN Corky Mull, MD      . metoCLOPramide (REGLAN) tablet 5-10 mg  5-10 mg Oral Q8H PRN Corky Mull, MD       Or  . metoCLOPramide (REGLAN) injection 5-10 mg  5-10 mg Intravenous Q8H PRN Corky Mull, MD      . multivitamin with minerals tablet 1 tablet  1 tablet Oral Daily Vishal Mungal, MD      . ondansetron (ZOFRAN) tablet 4 mg  4 mg Oral Q6H PRN Corky Mull, MD        Or  . ondansetron (ZOFRAN) injection 4 mg  4 mg Intravenous Q6H PRN Corky Mull, MD      . oxyCODONE (Oxy IR/ROXICODONE) immediate release tablet 5-10 mg  5-10 mg Oral Q3H PRN Corky Mull, MD      . pantoprazole (PROTONIX) EC tablet 40 mg  40 mg Oral Daily Corky Mull, MD      . simvastatin (ZOCOR) tablet 20 mg  20 mg Oral Daily Corky Mull, MD      . sodium phosphate (FLEET) 7-19 GM/118ML enema 1 enema  1 enema Rectal Once PRN Corky Mull, MD      . sulfamethoxazole-trimethoprim (BACTRIM DS,SEPTRA DS) 800-160 MG per tablet 2 tablet  2 tablet Oral BID Corky Mull, MD         Discharge Medications: Please see discharge summary for a list of discharge medications.  Relevant Imaging Results:  Relevant Lab Results:   Additional Information  (SSN: [id number])  Loralyn Freshwater, LCSW

## 2016-01-23 NOTE — OR Nursing (Signed)
Dr. Roland Rack and Dr. Andree Elk made aware that patient has completed three days.

## 2016-01-23 NOTE — Anesthesia Postprocedure Evaluation (Signed)
Anesthesia Post Note  Patient: ERIANNY WAKEHAM  Procedure(s) Performed: Procedure(s) (LRB): UNICOMPARTMENTAL KNEE (Left)  Patient location during evaluation: PACU Anesthesia Type: General Level of consciousness: awake and alert Pain management: pain level controlled Vital Signs Assessment: post-procedure vital signs reviewed and stable Respiratory status: spontaneous breathing, nonlabored ventilation, respiratory function stable and patient connected to nasal cannula oxygen Cardiovascular status: blood pressure returned to baseline and stable Postop Assessment: no signs of nausea or vomiting Anesthetic complications: no    Last Vitals:  Filed Vitals:   01/23/16 1032 01/23/16 1047  BP: 94/57 103/58  Pulse: 74 70  Temp:  36.4 C  Resp: 15 17    Last Pain: There were no vitals filed for this visit.  LLE Motor Response: No movement due to regional block (01/23/16 1047)   RLE Motor Response: No movement due to regional block (01/23/16 1047) RLE Sensation: No sensation (absent) (01/23/16 1047)      Molli Barrows

## 2016-01-23 NOTE — Transfer of Care (Signed)
Immediate Anesthesia Transfer of Care Note  Patient: Marissa Mullen  Procedure(s) Performed: Procedure(s): UNICOMPARTMENTAL KNEE (Left)  Patient Location: PACU  Anesthesia Type:Spinal  Level of Consciousness: awake, alert , oriented and patient cooperative  Airway & Oxygen Therapy: Patient Spontanous Breathing and Patient connected to nasal cannula oxygen  Post-op Assessment: Report given to RN and Post -op Vital signs reviewed and stable  Post vital signs: Reviewed and stable  Last Vitals:  Filed Vitals:   01/23/16 0608 01/23/16 1003  BP: 126/75 103/55  Pulse: 66 85  Temp: 36.6 C 36.6 C  Resp: 16 22    Complications: No apparent anesthesia complications

## 2016-01-23 NOTE — Evaluation (Signed)
Physical Therapy Evaluation Patient Details Name: Marissa Mullen MRN: [id number] DOB: [birth year] Today's Date: 01/23/2016   History of Present Illness  Pt is admitted for L partial knee replacement on 01/23/16.  Clinical Impression  Pt is a pleasant 75 year old female who was admitted for L partial knee replacement. Pt performs bed mobility, transfers, and ambulation with cga and rw. Pt able to perform SLR with independence, no need for KI at this time. Pt demonstrates deficits with strength/pain/endurance. Would benefit from skilled PT to address above deficits and promote optimal return to PLOF.      Follow Up Recommendations Home health PT    Equipment Recommendations       Recommendations for Other Services       Precautions / Restrictions Precautions Precautions: Knee Restrictions Weight Bearing Restrictions: Yes Other Position/Activity Restrictions: WBAT      Mobility  Bed Mobility Overal bed mobility: Needs Assistance Bed Mobility: Supine to Sit     Supine to sit: Min guard     General bed mobility comments: bed mobility performed with sequencing for hand placement. CGA given for scooting out towards EOB. Once seated at EOB, no LOB noted  Transfers Overall transfer level: Needs assistance Equipment used: Rolling walker (2 wheeled) Transfers: Sit to/from Stand Sit to Stand: Min guard         General transfer comment: safe technique performed using rw  Ambulation/Gait Ambulation/Gait assistance: Min guard Ambulation Distance (Feet): 3 Feet Assistive device: Rolling walker (2 wheeled) Gait Pattern/deviations: Step-to pattern     General Gait Details: ambulated to recliner. Small step to gait pattern performed.   Stairs            Wheelchair Mobility    Modified Rankin (Stroke Patients Only)       Balance Overall balance assessment: Needs assistance Sitting-balance support: Feet supported Sitting balance-Leahy Scale: Normal      Standing balance support: Bilateral upper extremity supported Standing balance-Leahy Scale: Good                               Pertinent Vitals/Pain Pain Assessment: 0-10 Pain Score: 2  Pain Location: L knee Pain Descriptors / Indicators: Aching Pain Intervention(s): Limited activity within patient's tolerance;Patient requesting pain meds-RN notified;RN gave pain meds during session;Ice applied    Home Living Family/patient expects to be discharged to:: Private residence Living Arrangements:  (will be going to daughter's house) Available Help at Discharge: Available 24 hours/day (daughter) Type of Home: House Home Access: Stairs to enter Entrance Stairs-Rails: None Entrance Stairs-Number of Steps: 2 Home Layout: One level Home Equipment: Environmental consultant - 2 wheels      Prior Function Level of Independence: Independent               Hand Dominance        Extremity/Trunk Assessment   Upper Extremity Assessment: Overall WFL for tasks assessed           Lower Extremity Assessment: Generalized weakness (R LE grossly 3/5; able to perform SLR without assistance. )         Communication   Communication: No difficulties  Cognition Arousal/Alertness: Awake/alert Behavior During Therapy: WFL for tasks assessed/performed Overall Cognitive Status: Within Functional Limits for tasks assessed                      General Comments      Exercises Total Joint Exercises Goniometric ROM:  L knee AROM: 0-72 degrees Other Exercises Other Exercises: Pt performed supine ther-ex including L LE ankle pumps, quad sets, SLRs, and hip abd/add. All ther-ex performed x 10 reps with cues for correct technique      Assessment/Plan    PT Assessment Patient needs continued PT services  PT Diagnosis Difficulty walking;Abnormality of gait;Acute pain   PT Problem List Decreased strength;Decreased range of motion;Decreased balance;Decreased mobility;Pain  PT Treatment  Interventions Gait training;DME instruction;Therapeutic activities;Therapeutic exercise   PT Goals (Current goals can be found in the Care Plan section) Acute Rehab PT Goals Patient Stated Goal: to go home PT Goal Formulation: With patient Time For Goal Achievement: 02/06/16 Potential to Achieve Goals: Good    Frequency BID   Barriers to discharge        Co-evaluation               End of Session Equipment Utilized During Treatment: Gait belt Activity Tolerance: Patient tolerated treatment well Patient left: in chair;with chair alarm set Nurse Communication: Mobility status         Time: [id number] PT Time Calculation (min) (ACUTE ONLY): 41 min   Charges:   PT Evaluation $PT Eval Moderate Complexity: 1 Procedure PT Treatments $Therapeutic Exercise: 8-22 mins   PT G Codes:        Autumn Gunn 2016-02-18, 3:40 PM Greggory Stallion, PT, DPT [phone]

## 2016-01-23 NOTE — Anesthesia Procedure Notes (Signed)
Spinal Patient location during procedure: OR Start time: 01/23/2016 7:45 AM Staffing Performed by: anesthesiologist  Preanesthetic Checklist Completed: patient identified, site marked, surgical consent, pre-op evaluation, timeout performed, IV checked, risks and benefits discussed and monitors and equipment checked Spinal Block Patient position: sitting Prep: Betadine Patient monitoring: heart rate, continuous pulse ox, blood pressure and cardiac monitor Approach: midline Location: L4-5 Injection technique: single-shot Needle Needle type: Whitacre and Introducer  Needle gauge: 24 G Needle length: 9 cm Assessment Sensory level: T4 Additional Notes Negative paresthesia. Negative blood return. Positive free-flowing CSF. Expiration date of kit checked and confirmed. Patient tolerated procedure well, without complications.

## 2016-01-23 NOTE — H&P (Signed)
Paper H&P to be scanned into permanent record. H&P reviewed. No changes. 

## 2016-01-23 NOTE — Anesthesia Preprocedure Evaluation (Signed)
Anesthesia Evaluation  Patient identified by MRN, date of birth, ID band Patient awake    Reviewed: Allergy & Precautions, H&P , NPO status , Patient's Chart, lab work & pertinent test results, reviewed documented beta blocker date and time   Airway Mallampati: II   Neck ROM: full    Dental  (+) Poor Dentition   Pulmonary neg pulmonary ROS, former smoker,    Pulmonary exam normal        Cardiovascular hypertension, negative cardio ROS Normal cardiovascular exam     Neuro/Psych negative neurological ROS  negative psych ROS   GI/Hepatic negative GI ROS, Neg liver ROS, GERD  ,(+) Hepatitis -  Endo/Other  negative endocrine ROS  Renal/GU negative Renal ROS  negative genitourinary   Musculoskeletal   Abdominal   Peds  Hematology negative hematology ROS (+)   Anesthesia Other Findings Past Medical History:   Hypertension                                                 GERD (gastroesophageal reflux disease)                       Arthritis                                                    Hepatitis                                                      Comment:"Age 75"   Hypercholesterolemia                                       Past Surgical History:   ABDOMINAL HYSTERECTOMY                                      BMI    Body Mass Index   29.16 kg/m 2     Reproductive/Obstetrics                             Anesthesia Physical Anesthesia Plan  ASA: III  Anesthesia Plan: General and Spinal   Post-op Pain Management:    Induction:   Airway Management Planned:   Additional Equipment:   Intra-op Plan:   Post-operative Plan:   Informed Consent: I have reviewed the patients History and Physical, chart, labs and discussed the procedure including the risks, benefits and alternatives for the proposed anesthesia with the patient or authorized representative who has indicated his/her  understanding and acceptance.   Dental Advisory Given  Plan Discussed with: CRNA  Anesthesia Plan Comments:         Anesthesia Quick Evaluation

## 2016-01-24 LAB — BASIC METABOLIC PANEL
ANION GAP: 5 (ref 5–15)
BUN: 8 mg/dL (ref 6–20)
CHLORIDE: 105 mmol/L (ref 101–111)
CO2: 26 mmol/L (ref 22–32)
Calcium: 8.8 mg/dL — ABNORMAL LOW (ref 8.9–10.3)
Creatinine, Ser: 0.89 mg/dL (ref 0.44–1.00)
GFR calc non Af Amer: >60 mL/min (ref >60)
GLUCOSE: 90 mg/dL (ref 65–99)
Potassium: 4.8 mmol/L (ref 3.5–5.1)
Sodium: 136 mmol/L (ref 135–145)

## 2016-01-24 LAB — CBC WITH DIFFERENTIAL/PLATELET
BASOS ABS: 0 10*3/uL (ref 0–0.1)
BASOS PCT: 0 %
Eosinophils Absolute: 0.2 10*3/uL (ref 0–0.7)
Eosinophils Relative: 3 %
HEMATOCRIT: 38.2 % (ref 35.0–47.0)
HEMOGLOBIN: 12.8 g/dL (ref 12.0–16.0)
LYMPHS PCT: 18 %
Lymphs Abs: 1.1 10*3/uL (ref 1.0–3.6)
MCH: 32.2 pg (ref 26.0–34.0)
MCHC: 33.6 g/dL (ref 32.0–36.0)
MCV: 96 fL (ref 80.0–100.0)
MONO ABS: 0.5 10*3/uL (ref 0.2–0.9)
MONOS PCT: 9 %
NEUTROS ABS: 4.1 10*3/uL (ref 1.4–6.5)
NEUTROS PCT: 70 %
Platelets: 144 10*3/uL — ABNORMAL LOW (ref 150–440)
RBC: 3.98 MIL/uL (ref 3.80–5.20)
RDW: 13.1 % (ref 11.5–14.5)
WBC: 5.9 10*3/uL (ref 3.6–11.0)

## 2016-01-24 MED ORDER — OXYCODONE HCL 5 MG PO TABS: 5.0000 mg | ORAL_TABLET | ORAL | Status: DC | PRN

## 2016-01-24 MED ORDER — TRAMADOL HCL 50 MG PO TABS
50.0000 mg | ORAL_TABLET | Freq: Four times a day (QID) | ORAL | Status: DC | PRN
Start: 2016-01-24 — End: 2016-01-24

## 2016-01-24 MED ORDER — ENOXAPARIN SODIUM 40 MG/0.4ML ~~LOC~~ SOLN: 40.0000 mg | SUBCUTANEOUS | Status: DC

## 2016-01-24 MED ORDER — HYDROMORPHONE HCL 1 MG/ML IJ SOLN: 0.2500 mg | INTRAMUSCULAR | Status: DC | PRN

## 2016-01-24 MED ORDER — TRAMADOL HCL 50 MG PO TABS: 50.0000 mg | ORAL_TABLET | Freq: Four times a day (QID) | ORAL | Status: DC | PRN

## 2016-01-24 NOTE — Progress Notes (Signed)
Subjective: 1 Day Post-Op Procedure(s) (LRB): UNICOMPARTMENTAL KNEE (Left) Patient reports pain as 4 on 0-10 scale.   Patient is well, and has had no acute complaints or problems Plan is to go home with homehealth PT after hospital stay. Negative for chest pain and shortness of breath Fever: no Gastrointestinal:Positive for nausea yesterday  Objective: Vital signs in last 24 hours: Temp:  [97.4 F (36.3 C)-98.7 F (37.1 C)] 98.3 F (36.8 C) (01/27 0715) Pulse Rate:  [57-85] 79 (01/27 0715) Resp:  [15-22] 18 (01/27 0715) BP: (94-114)/(52-65) 107/55 mmHg (01/27 0715) SpO2:  [90 %-100 %] 91 % (01/27 0715)  Intake/Output from previous day:  Intake/Output Summary (Last 24 hours) at 01/24/16 0744 Last data filed at 01/24/16 0448  Gross per 24 hour  Intake 3866.67 ml  Output   2370 ml  Net 1496.67 ml    Intake/Output this shift:    Labs:  Recent Labs  01/23/16 0629 01/24/16 0526  HGB 15.6* 12.8    Recent Labs  01/23/16 0629 01/24/16 0526  WBC  --  5.9  RBC  --  3.98  HCT 46.0 38.2  PLT  --  144*    Recent Labs  01/23/16 0629 01/23/16 1318 01/24/16 0526  NA 140  --  136  K 4.7 4.3 4.8  CL  --   --  105  CO2  --   --  26  BUN  --   --  8  CREATININE  --   --  0.89  GLUCOSE 98  --  90  CALCIUM  --   --  8.8*   No results for input(s): LABPT, INR in the last 72 hours.   EXAM General - Patient is Alert, Appropriate and Oriented Extremity - ABD soft Sensation intact distally Intact pulses distally Dorsiflexion/Plantar flexion intact Incision: dressing C/D/I Dressing/Incision - clean, dry, no drainage Motor Function - intact, moving foot and toes well on exam.   Past Medical History  Diagnosis Date  . Hypertension   . GERD (gastroesophageal reflux disease)   . Arthritis   . Hepatitis     "Age 75"  . Hypercholesterolemia     Assessment/Plan: 1 Day Post-Op Procedure(s) (LRB): UNICOMPARTMENTAL KNEE (Left) Active Problems:   Status post left  partial knee replacement  Estimated body mass index is 29.17 kg/(m^2) as calculated from the following:   Height as of this encounter: 5\' 4"  (1.626 m).   Weight as of this encounter: 77.111 kg (170 lb). Advance diet Up with therapy D/C IV fluids when tolerating PO diet  Can d/c IVF when tolerating po diet, advance diet slowly today. Plan on discharge home tomorrow, 01/25/16  DVT Prophylaxis - Lovenox, Foot Pumps and TED hose Weight-Bearing as tolerated to left leg  J. Cameron Proud, PA-C [facility] 01/24/2016, 7:44 AM

## 2016-01-24 NOTE — Progress Notes (Signed)
Clinical Social Worker (CSW) received SNF consult. PT is recommending home health. RN Case Manager is aware of above. Please reconsult if future social work needs arise. CSW signing off.   Lucky Trotta Morgan, LCSW [phone] 

## 2016-01-24 NOTE — Discharge Instructions (Signed)
Diet: As you were doing prior to hospitalization  ° °Shower:  May shower but keep the wounds dry, use an occlusive plastic wrap, NO SOAKING IN TUB.  If the bandage gets wet, change with a clean dry gauze. ° °Dressing:  You may change your dressing as needed. Change the dressing with sterile gauze dressing.   ° °Activity:  Increase activity slowly as tolerated, but follow the weight bearing instructions below.  No lifting or driving for 6 weeks. ° °Weight Bearing:   Weight bearing as tolerated to left/right lower extremity ° °To prevent constipation: you may use a stool softener such as - ° °Colace (over the counter) 100 mg by mouth twice a day  °Drink plenty of fluids (prune juice may be helpful) and high fiber foods °Miralax (over the counter) for constipation as needed.   ° °Itching:  If you experience itching with your medications, try taking only a single pain pill, or even half a pain pill at a time.  You may take up to 10 pain pills per day, and you can also use benadryl over the counter for itching or also to help with sleep.  ° °Precautions:  If you experience chest pain or shortness of breath - call 911 immediately for transfer to the hospital emergency department!! ° °If you develop a fever greater that 101 F, purulent drainage from wound, increased redness or drainage from wound, or calf pain-Call [facility]                                              °Follow- Up Appointment:  Please call for an appointment to be seen in 2 weeks at [facility] °

## 2016-01-24 NOTE — Care Management Note (Signed)
Case Management Note  Patient Details  Name: KHILEE HENDRICKSEN MRN: [id number] Date of Birth: 1941/08/02  Subjective/Objective:                  Met with patient and two daughters to discuss discharge planning. She will be going to her daughter Darrick [place])  address: [place] [address] [phone].She a rolling walker available at home. She declines a bedside commode. She would like to use [facility] for HHPT. She uses [facility] for rx [phone].  Action/Plan: List of home health agencies left with patient. Referral called to [facility]. Lovenox 59m #14 called in to [facility] cost $102.06- patient aware and agrees. RNCM will continue to follow.   Expected Discharge Date:  01/25/16               Expected Discharge Plan:     In-House Referral:     Discharge planning Services  CM Consult  Post Acute Care Choice:  Home Health Choice offered to:  Patient  DME Arranged:    DME Agency:     HH Arranged:  PT HH Agency:  [facility] Status of Service:  In process, will continue to follow  Medicare Important Message Given:    Date Medicare IM Given:    Medicare IM give by:    Date Additional Medicare IM Given:    Additional Medicare Important Message give by:     If discussed at [place] [place] Meet[place], dates discussed:    Additional Comments:  AMarshell Garfinkel RN 01/24/2016, 12:23 PM

## 2016-01-24 NOTE — Progress Notes (Signed)
Physical Therapy Treatment Patient Details Name: Marissa Mullen MRN: [id number] DOB: [birth year] Today's Date: 01/24/2016    History of Present Illness Pt is admitted for L partial knee replacement on 01/23/16.    PT Comments    Pt is making good progress towards goals, however limited by pain and nausea. Pt very motivated to perform therapy. Pt vomited during session, therefore ROM to be performed in PM session. Good endurance with hep.  Follow Up Recommendations  Home health PT     Equipment Recommendations       Recommendations for Other Services       Precautions / Restrictions Precautions Precautions: Knee Precaution Booklet Issued: No Restrictions Weight Bearing Restrictions: Yes LLE Weight Bearing: Weight bearing as tolerated    Mobility  Bed Mobility Overal bed mobility: Needs Assistance Bed Mobility: Supine to Sit     Supine to sit: Min assist     General bed mobility comments: bed mobility performed with cues for sequencing. Min assist required for scooting out towards EOB  Transfers Overall transfer level: Needs assistance Equipment used: Rolling walker (2 wheeled) Transfers: Sit to/from Stand Sit to Stand: Min guard         General transfer comment: safe technique performed using rw with pt pushing from seated surface.   Ambulation/Gait Ambulation/Gait assistance: Min guard Ambulation Distance (Feet): 70 Feet Assistive device: Rolling walker (2 wheeled) Gait Pattern/deviations: Step-to pattern     General Gait Details: ambulated using step to gait pattern. Pt reports being nauseated during therapy and once she sits in recliner, she begins to vomit. RN in room. Further ambulation deferred.    Stairs            Wheelchair Mobility    Modified Rankin (Stroke Patients Only)       Balance                                    Cognition Arousal/Alertness: Awake/alert Behavior During Therapy: WFL for tasks  assessed/performed Overall Cognitive Status: Within Functional Limits for tasks assessed                      Exercises Total Joint Exercises Goniometric ROM: to be performed in PM session Other Exercises Other Exercises: supine ther-ex performed including L ankle pumps, quad sets, SLRs, hip abd/add, and glut sets. All ther-ex performed x 10 reps with cga and cues for correct technique.    General Comments        Pertinent Vitals/Pain Pain Assessment: 0-10 Pain Score: 8  Pain Location: L knee Pain Descriptors / Indicators: Operative site guarding Pain Intervention(s): Limited activity within patient's tolerance;Patient requesting pain meds-RN notified;RN gave pain meds during session    Home Living                      Prior Function            PT Goals (current goals can now be found in the care plan section) Acute Rehab PT Goals Patient Stated Goal: to go home PT Goal Formulation: With patient Time For Goal Achievement: 02/06/16 Potential to Achieve Goals: Good Progress towards PT goals: Progressing toward goals    Frequency  BID    PT Plan Current plan remains appropriate    Co-evaluation             End of Session Equipment Utilized During Treatment:  Gait belt Activity Tolerance: Patient tolerated treatment well Patient left: in chair;with chair alarm set     Time: [phone] PT Time Calculation (min) (ACUTE ONLY): 33 min  Charges:  $Gait Training: 8-22 mins $Therapeutic Exercise: 8-22 mins                    G Codes:      Xariah Silvernail 22-Feb-2016, 12:05 PM Greggory Stallion, PT, DPT [phone]

## 2016-01-24 NOTE — Progress Notes (Signed)
Physical Therapy Treatment Patient Details Name: Marissa Mullen MRN: [id number] DOB: 1941/10/07 Today's Date: 01/24/2016    History of Present Illness Pt is admitted for L partial knee replacement on 01/23/16.    PT Comments    Pt is making good progress towards goals, only limited secondary to nausea. Pt with increased ambulation distance performed and is motivated to perform therapy. Pt on room air during treatment with O2 sats decreasing to 88%. Pt encouraged and performed IS while in room. O2 sats at 91% at rest. Good endurance with there-ex and progress made towards ROM. Continue to plan therapy session around nausea/pain meds. HEP administered and reviewed.  Follow Up Recommendations  Home health PT     Equipment Recommendations       Recommendations for Other Services       Precautions / Restrictions Precautions Precautions: Knee Precaution Booklet Issued: No Restrictions Weight Bearing Restrictions: Yes LLE Weight Bearing: Weight bearing as tolerated    Mobility  Bed Mobility Overal bed mobility: Needs Assistance Bed Mobility: Supine to Sit     Supine to sit: Min assist     General bed mobility comments: bed mobility performed with cues for sequencing. Min assist required for scooting out towards EOB  Transfers Overall transfer level: Needs assistance Equipment used: Rolling walker (2 wheeled) Transfers: Sit to/from Stand Sit to Stand: Min guard         General transfer comment: safe technique performed using rw with pt pushing from seated surface.   Ambulation/Gait Ambulation/Gait assistance: Min guard Ambulation Distance (Feet): 110 Feet Assistive device: Rolling walker (2 wheeled) Gait Pattern/deviations: Step-through pattern     General Gait Details: ambulated with step to gait pattern, improving to reciprocal gait pattern. Safe technique performed. Pt cued to roll rw instead of picking it up. Pt with increased nausea symptoms during mobility.  Upright posture performed   Stairs            Wheelchair Mobility    Modified Rankin (Stroke Patients Only)       Balance                                    Cognition Arousal/Alertness: Awake/alert Behavior During Therapy: WFL for tasks assessed/performed Overall Cognitive Status: Within Functional Limits for tasks assessed                      Exercises Total Joint Exercises Goniometric ROM: L knee AROM: 0-76 degrees Other Exercises Other Exercises: supine ther-ex performed including L LE ankle pumps, quad sets, glut sets, hip abd/add, SAQ, and SLR. All ther-ex performed x 12 reps with cga and cues for correct technique Other Exercises: Pt assisted to ambulated to bathroom with cga and supervision for set up. Safe technique performed. No BSC used.    General Comments        Pertinent Vitals/Pain Pain Assessment: 0-10 Pain Score: 6  Pain Location: L knee Pain Descriptors / Indicators: Operative site guarding Pain Intervention(s): Limited activity within patient's tolerance    Home Living                      Prior Function            PT Goals (current goals can now be found in the care plan section) Acute Rehab PT Goals Patient Stated Goal: to go home PT Goal Formulation: With patient Time  For Goal Achievement: 02/06/16 Potential to Achieve Goals: Good Progress towards PT goals: Progressing toward goals    Frequency  BID    PT Plan Current plan remains appropriate    Co-evaluation             End of Session Equipment Utilized During Treatment: Gait belt Activity Tolerance: Patient tolerated treatment well Patient left: in bed;with bed alarm set;with family/visitor present     Time: 1355-1430 PT Time Calculation (min) (ACUTE ONLY): 35 min  Charges:  $Gait Training: 8-22 mins $Therapeutic Exercise: 8-22 mins                    G Codes:      Wendell Nicoson January 31, 2016, 5:08 PM  Greggory Stallion, PT,  DPT [phone]

## 2016-01-24 NOTE — Discharge Summary (Signed)
Physician Discharge Summary  Patient ID: Marissa Mullen MRN: [id number] DOB/AGE: 73-Apr-1942 74 y.o.  Admit date: 01/23/2016 Discharge date: 01/25/16  Admission Diagnoses:  PRIMARY OSTEOARTHRITIS Osteoarthritis of medial compartment, left knee  Discharge Diagnoses: Patient Active Problem List   Diagnosis Date Noted  . Status post left partial knee replacement 01/23/2016  Osteoarthritis of medial compartment, left knee  Past Medical History  Diagnosis Date  . Hypertension   . GERD (gastroesophageal reflux disease)   . Arthritis   . Hepatitis     "Age 7"  . Hypercholesterolemia      Transfusion: None   Consultants (if any):    Discharged Condition: Improved  Hospital Course: IKESHA Mullen is an 75 y.o. female who was admitted 01/23/2016 with a diagnosis of osteoarthritis of medial compartment of the left knee and went to the operating room on 01/23/2016 and underwent the above named procedures.    Surgeries: Procedure(s): UNICOMPARTMENTAL KNEE on 01/23/2016 Patient tolerated the surgery well. Taken to PACU where she was stabilized and then transferred to the orthopedic floor.  Started on Lovenox 40mg  q 24 hrs. Foot pumps applied bilaterally at 80 mm. Heels elevated on bed with rolled towels. No evidence of DVT. Negative Homan. Physical therapy started on day #1 for gait training and transfer. OT started day #1 for ADL and assisted devices.  Patient's IV , foley and hemovac was d/c on day #2.  Implants: All-cemented Bi[employer] [place] system with a Small femoral component, a "B" sized tibial tray, and a 5 mm meniscal bearing insert.  She was given perioperative antibiotics:  Anti-infectives    Start     Dose/Rate Route Frequency Ordered Stop   01/23/16 1115  sulfamethoxazole-trimethoprim (BACTRIM DS,SEPTRA DS) 800-160 MG per tablet 2 tablet     2 tablet Oral 2 times daily 01/23/16 1113     01/23/16 1115  ceFAZolin (ANCEF) IVPB 2 g/50 mL premix     2 g 100 mL/hr over 30  Minutes Intravenous Every 6 hours 01/23/16 1113 01/23/16 2355   01/23/16 0615  ceFAZolin (ANCEF) IVPB 2 g/50 mL premix     2 g 100 mL/hr over 30 Minutes Intravenous  Once 01/23/16 0601 01/23/16 0822   01/23/16 0554  ceFAZolin (ANCEF) 2-3 GM-% IVPB SOLR    Comments:  Marissa Mullen: cabinet override      01/23/16 0554 01/23/16 1759    . She was given sequential compression devices, early ambulation, and lovenox for DVT prophylaxis.  She benefited maximally from the hospital stay and there were no complications.    Recent vital signs:  Filed Vitals:   01/24/16 0447 01/24/16 0715  BP: 102/52 107/55  Pulse: 67 79  Temp: 98.4 F (36.9 C) 98.3 F (36.8 C)  Resp: 19 18    Recent laboratory studies:  Lab Results  Component Value Date   HGB 12.8 01/24/2016   HGB 15.6* 01/23/2016   HGB 14.1 01/15/2016   Lab Results  Component Value Date   WBC 5.9 01/24/2016   PLT 144* 01/24/2016   Lab Results  Component Value Date   INR 0.93 01/15/2016   Lab Results  Component Value Date   NA 136 01/24/2016   K 4.8 01/24/2016   CL 105 01/24/2016   CO2 26 01/24/2016   BUN 8 01/24/2016   CREATININE 0.89 01/24/2016   GLUCOSE 90 01/24/2016    Discharge Medications:     Medication List    TAKE these medications  amLODipine 10 MG tablet  Commonly known as:  NORVASC  Take 10 mg by mouth daily.     aspirin 325 MG tablet  Take 162 mg by mouth daily.     atenolol 25 MG tablet  Commonly known as:  TENORMIN  Take 12.5 mg by mouth daily.     benazepril 40 MG tablet  Commonly known as:  LOTENSIN  Take 40 mg by mouth daily.     CALCIUM 600+D 600-800 MG-UNIT Tabs  Generic drug:  Calcium Carb-Cholecalciferol  Take 2 tablets by mouth daily.     enoxaparin 40 MG/0.4ML injection  Commonly known as:  LOVENOX  Inject 0.4 mLs (40 mg total) into the skin daily.     oxyCODONE 5 MG immediate release tablet  Commonly known as:  Oxy IR/ROXICODONE  Take 1-2 tablets (5-10 mg total) by  mouth every 4 (four) hours as needed for breakthrough pain.     pantoprazole 40 MG tablet  Commonly known as:  PROTONIX  Take 40 mg by mouth daily.     potassium chloride SA 20 MEQ tablet  Commonly known as:  K-DUR,KLOR-CON  Take 10 mEq by mouth 2 (two) times daily.     simvastatin 20 MG tablet  Commonly known as:  ZOCOR  Take 20 mg by mouth daily.     sulfamethoxazole-trimethoprim 800-160 MG tablet  Commonly known as:  BACTRIM DS,SEPTRA DS  Take 2 tablets by mouth 2 (two) times daily.     traMADol 50 MG tablet  Commonly known as:  ULTRAM  Take 1-2 tablets (50-100 mg total) by mouth every 6 (six) hours as needed.     TYLENOL 8 HOUR 650 MG CR tablet  Generic drug:  acetaminophen  Take 1,300 mg by mouth every 8 (eight) hours as needed for pain. Reported on 01/23/2016     WOMENS MULTIVITAMIN PLUS Tabs  Take 1 tablet by mouth daily.        Diagnostic Studies: Dg Chest 2 View  01/15/2016  CLINICAL DATA:  Preoperative evaluation, hypertension, former smoker EXAM: CHEST  2 VIEW COMPARISON:  None FINDINGS: Normal heart size, mediastinal contours, and pulmonary vascularity. Atherosclerotic calcification and minimal elongation of thoracic aorta. Linear subsegmental atelectasis versus scarring at lingula. Lungs otherwise clear without pulmonary infiltrate or pleural effusion. Calcified granuloma at LEFT upper lobe versus calcification of the anterior LEFT second costochondral junction. Diffuse osseous demineralization. IMPRESSION: Subsegmental atelectasis versus scarring at lingula. Electronically Signed   By: Lavonia Dana M.D.   On: 01/15/2016 16:31   Dg Knee Left Port  01/23/2016  CLINICAL DATA:  Status post left partial knee replacement. EXAM: PORTABLE LEFT KNEE - 1-2 VIEW COMPARISON:  12/20/2015 MRI FINDINGS: Patient has undergone medial compartment arthroplasty. Surgical clips overlie the anterior portion of the knee. Air is now identified within the joint space. The hardware appears well  seated. No apparent complications. IMPRESSION: Status post partial knee arthroplasty.  No adverse features. Electronically Signed   By: Nolon Nations M.D.   On: 01/23/2016 10:57    Disposition: Pt is stable and ready for discharge on 01/24/16.  Plan on discharge tomorrow pending advanced diet, urinating without foley and recommendation from PT.  Pt will continue lovenox 40mg  for 14 days daily.  Follow-up with [facility] in 2 weeks.       Follow-up Information    Follow up with Durward Fortes., MD On 01/28/2016.   Specialty:  Podiatry   Why:  AT 4PM   Contact information:  [place] [facility] [place] [address] [phone]       Follow up with Judson Roch, PA-C In 14 days.   Specialty:  Physician Assistant   Why:  For staple removal, For wound re-check   Contact information:   [place] [address] [phone]      Signed: Judson Roch PA-C 01/24/2016, 7:50 AM

## 2016-01-25 LAB — BASIC METABOLIC PANEL
ANION GAP: 6 (ref 5–15)
BUN: 7 mg/dL (ref 6–20)
CALCIUM: 8.8 mg/dL — AB (ref 8.9–10.3)
CO2: 27 mmol/L (ref 22–32)
Chloride: 103 mmol/L (ref 101–111)
Creatinine, Ser: 0.8 mg/dL (ref 0.44–1.00)
GFR calc Af Amer: >60 mL/min (ref >60)
GFR calc non Af Amer: >60 mL/min (ref >60)
GLUCOSE: 107 mg/dL — AB (ref 65–99)
POTASSIUM: 4.8 mmol/L (ref 3.5–5.1)
Sodium: 136 mmol/L (ref 135–145)

## 2016-01-25 LAB — CBC
HEMATOCRIT: 38.2 % (ref 35.0–47.0)
HEMOGLOBIN: 12.9 g/dL (ref 12.0–16.0)
MCH: 32.2 pg (ref 26.0–34.0)
MCHC: 33.9 g/dL (ref 32.0–36.0)
MCV: 95 fL (ref 80.0–100.0)
Platelets: 138 10*3/uL — ABNORMAL LOW (ref 150–440)
RBC: 4.02 MIL/uL (ref 3.80–5.20)
RDW: 12.7 % (ref 11.5–14.5)
WBC: 7.4 10*3/uL (ref 3.6–11.0)

## 2016-01-25 MED ORDER — ONDANSETRON 4 MG PO TBDP: 4.0000 mg | ORAL_TABLET | Freq: Three times a day (TID) | ORAL | Status: DC | PRN

## 2016-01-25 MED ORDER — LACTULOSE 10 GM/15ML PO SOLN
20.0000 g | Freq: Two times a day (BID) | ORAL | Status: DC | PRN
  Administered 2016-01-25: 20 g via ORAL
  Filled 2016-01-25: qty 30

## 2016-01-25 MED ORDER — ONDANSETRON 4 MG PO TBDP
4.0000 mg | ORAL_TABLET | Freq: Three times a day (TID) | ORAL | Status: DC | PRN
  Filled 2016-01-25: qty 1

## 2016-01-25 NOTE — Progress Notes (Addendum)
Subjective: 2 Days Post-Op Procedure(s) (LRB): UNICOMPARTMENTAL KNEE (Left) Patient reports pain as mild.   Patient is well, and has had no acute complaints or problems Plan is to go home with homehealth PT after hospital stay. Negative for chest pain and shortness of breath Fever: no   Objective: Vital signs in last 24 hours: Temp:  [99.4 F (37.4 C)-100.1 F (37.8 C)] 100.1 F (37.8 C) (01/28 0750) Pulse Rate:  [70-93] 92 (01/28 0750) Resp:  [18-19] 18 (01/28 0750) BP: (105-123)/(55-62) 121/62 mmHg (01/28 0750) SpO2:  [90 %-98 %] 98 % (01/28 0750)  Intake/Output from previous day:  Intake/Output Summary (Last 24 hours) at 01/25/16 0930 Last data filed at 01/25/16 0259  Gross per 24 hour  Intake    840 ml  Output   1100 ml  Net   -260 ml    Intake/Output this shift:    Labs:  Recent Labs  01/23/16 0629 01/24/16 0526 01/25/16 0406  HGB 15.6* 12.8 12.9    Recent Labs  01/24/16 0526 01/25/16 0406  WBC 5.9 7.4  RBC 3.98 4.02  HCT 38.2 38.2  PLT 144* 138*    Recent Labs  01/24/16 0526 01/25/16 0406  NA 136 136  K 4.8 4.8  CL 105 103  CO2 26 27  BUN 8 7  CREATININE 0.89 0.80  GLUCOSE 90 107*  CALCIUM 8.8* 8.8*   No results for input(s): LABPT, INR in the last 72 hours.   EXAM General - Patient is Alert, Appropriate and Oriented Extremity - Sensation intact distally Intact pulses distally Dorsiflexion/Plantar flexion intact Incision: dressing C/D/I and new dressing applied No cellulitis present Dressing/Incision - clean, dry, no drainage Motor Function - intact, moving foot and toes well on exam.   Past Medical History  Diagnosis Date  . Hypertension   . GERD (gastroesophageal reflux disease)   . Arthritis   . Hepatitis     "Age 75"  . Hypercholesterolemia     Assessment/Plan: 2 Days Post-Op Procedure(s) (LRB): UNICOMPARTMENTAL KNEE (Left) Active Problems:   Status post left partial knee replacement  Estimated body mass index is  29.17 kg/(m^2) as calculated from the following:   Height as of this encounter: 5\' 4"  (1.626 m).   Weight as of this encounter: 77.111 kg (170 lb). Advance diet Up with therapy  Plan on discharge home with home health pending BM and progress with PT  DVT Prophylaxis - Lovenox, Foot Pumps and TED hose Weight-Bearing as tolerated to left leg  Duanne Guess, PA-C [facility] 01/25/2016, 9:30 AM

## 2016-01-25 NOTE — Progress Notes (Addendum)
DISCHARGE NOTE:  Pt given discharge instructions. (Prescriptions given to her daughters at bedside earlier in the day). Pt verbalized understanding. Pt will make follow up appt with Dr. Nicholaus Bloom office.  Pt Wheeled to car by staff.

## 2016-01-25 NOTE — Progress Notes (Signed)
Physical Therapy Treatment Patient Details Name: Marissa Mullen MRN: [id number] DOB: 1941/03/05 Today's Date: 01/25/2016    History of Present Illness Pt is admitted for L partial knee replacement on 01/23/16.    PT Comments    Pt continues with nausea this session but able to complete walking around nursing station and complete stair training.  Pt initially with increased guarding of L knee as demonstrated with toe walking and was able to progress to foot flat with verbal instruction and demonstration.  Pt with good safety awareness with all mobility.  Daughter present throughout treatment and all questions answered.   Follow Up Recommendations  Home health PT     Equipment Recommendations       Recommendations for Other Services       Precautions / Restrictions Precautions Precautions: Knee Precaution Booklet Issued: No Restrictions Weight Bearing Restrictions: Yes LLE Weight Bearing: Weight bearing as tolerated    Mobility  Bed Mobility Overal bed mobility: Modified Independent Bed Mobility: Supine to Sit     Supine to sit: Modified independent (Device/Increase time)     General bed mobility comments: HOB elevated; able to elevate trunk and rotate to EOB with Mod I  Transfers Overall transfer level: Modified independent Equipment used: Rolling walker (2 wheeled) Transfers: Sit to/from Stand Sit to Stand: Modified independent (Device/Increase time)         General transfer comment: wall rail in BR to rise to stand; good safety awareness when getting up/down from chair/bed.  Ambulation/Gait Ambulation/Gait assistance: Supervision Ambulation Distance (Feet): 200 Feet Assistive device: Rolling walker (2 wheeled) Gait Pattern/deviations: Step-through pattern     General Gait Details: Increased nausea during gait session requiring seated rest break x5 min with dry heaving and sweating; initiate gait with toe walking on L but able to progress to foot flat  with verbal instruction.   Stairs Stairs: Yes Stairs assistance: Min guard Stair Management: One rail Right;Two rails;Step to pattern Number of Stairs: 4 General stair comments: Verbal instruction and demonstration for stairs using 2 and 1 rail with CGA.  Wheelchair Mobility    Modified Rankin (Stroke Patients Only)       Balance                                    Cognition Arousal/Alertness: Awake/alert Behavior During Therapy: WFL for tasks assessed/performed Overall Cognitive Status: Within Functional Limits for tasks assessed                      Exercises Total Joint Exercises Ankle Circles/Pumps: AROM;Both;10 reps Mullen Sets: Strengthening;Both;10 reps Heel Slides: AROM;Strengthening;10 reps Hip ABduction/ADduction: Strengthening;Left;10 reps Straight Leg Raises: Strengthening;Left;10 reps Marissa Mullen: AAROM;Left;10 reps Goniometric ROM: grossly measured 0-80 degrees    General Comments        Pertinent Vitals/Pain Pain Location: L knee Pain Descriptors / Indicators: Operative site guarding Pain Intervention(s): Limited activity within patient's tolerance;Monitored during session    Home Living                      Prior Function            PT Goals (current goals can now be found in the care plan section) Acute Rehab PT Goals Patient Stated Goal: to go home PT Goal Formulation: With patient Time For Goal Achievement: 02/06/16 Potential to Achieve Goals: Good Progress towards PT goals: Progressing  toward goals    Frequency  BID    PT Plan Current plan remains appropriate    Co-evaluation             End of Session Equipment Utilized During Treatment: Gait belt Activity Tolerance: Patient tolerated treatment well Patient left: in chair;with call bell/phone within reach;with chair alarm set;with family/visitor present     Time: [id number] PT Time Calculation (min) (ACUTE ONLY): 38 min  Charges:   $Gait Training: 23-37 mins $Therapeutic Exercise: 8-22 mins                    G Codes:      Baruch Lewers A Tyrease Vandeberg 2016-02-23, 12:01 PM

## 2016-01-25 NOTE — Progress Notes (Signed)
Prescription for Zofran given to family to drop off at pharmacy.

## 2016-01-25 NOTE — Progress Notes (Signed)
Prescriptions for Tramadol, Lovenox, and Oxycodone given to family to drop off at pharmacy.

## 2016-06-20 ENCOUNTER — Encounter: Payer: Self-pay | Admitting: *Deleted

## 2016-06-20 ENCOUNTER — Emergency Department
Admission: EM | Admit: 2016-06-20 | Discharge: 2016-06-20 | Disposition: A | Discharge status: Home / Self Care | Payer: Medicare Other | Attending: Emergency Medicine | Admitting: Emergency Medicine

## 2016-06-20 DIAGNOSIS — K625 Hemorrhage of anus and rectum: Secondary | ICD-10-CM | POA: Diagnosis present

## 2016-06-20 DIAGNOSIS — Z79899 Other long term (current) drug therapy: Secondary | ICD-10-CM | POA: Insufficient documentation

## 2016-06-20 DIAGNOSIS — I10 Essential (primary) hypertension: Secondary | ICD-10-CM | POA: Diagnosis not present

## 2016-06-20 DIAGNOSIS — Z96652 Presence of left artificial knee joint: Secondary | ICD-10-CM | POA: Insufficient documentation

## 2016-06-20 DIAGNOSIS — Z87891 Personal history of nicotine dependence: Secondary | ICD-10-CM | POA: Insufficient documentation

## 2016-06-20 DIAGNOSIS — M1990 Unspecified osteoarthritis, unspecified site: Secondary | ICD-10-CM | POA: Diagnosis not present

## 2016-06-20 DIAGNOSIS — Z7982 Long term (current) use of aspirin: Secondary | ICD-10-CM | POA: Diagnosis not present

## 2016-06-20 DIAGNOSIS — K922 Gastrointestinal hemorrhage, unspecified: Secondary | ICD-10-CM | POA: Diagnosis not present

## 2016-06-20 LAB — CBC
HCT: 43.8 % (ref 35.0–47.0)
HCT: 42.1 % (ref 35.0–47.0)
HEMOGLOBIN: 15.3 g/dL (ref 12.0–16.0)
Hemoglobin: 14.6 g/dL (ref 12.0–16.0)
MCH: 32 pg (ref 26.0–34.0)
MCH: 32.5 pg (ref 26.0–34.0)
MCHC: 35 g/dL (ref 32.0–36.0)
MCHC: 34.6 g/dL (ref 32.0–36.0)
MCV: 91.2 fL (ref 80.0–100.0)
MCV: 93.9 fL (ref 80.0–100.0)
PLATELETS: 204 10*3/uL (ref 150–440)
PLATELETS: 148 10*3/uL — AB (ref 150–440)
RBC: 4.8 MIL/uL (ref 3.80–5.20)
RBC: 4.48 MIL/uL (ref 3.80–5.20)
RDW: 13.3 % (ref 11.5–14.5)
RDW: 13.1 % (ref 11.5–14.5)
WBC: 10.5 10*3/uL (ref 3.6–11.0)
WBC: 8.4 10*3/uL (ref 3.6–11.0)

## 2016-06-20 LAB — COMPREHENSIVE METABOLIC PANEL
ALBUMIN: 4.7 g/dL (ref 3.5–5.0)
ALK PHOS: 93 U/L (ref 38–126)
ALT: 42 U/L (ref 14–54)
ANION GAP: 10 (ref 5–15)
AST: 34 U/L (ref 15–41)
BILIRUBIN TOTAL: 1.5 mg/dL — AB (ref 0.3–1.2)
BUN: 12 mg/dL (ref 6–20)
CALCIUM: 9.7 mg/dL (ref 8.9–10.3)
CO2: 22 mmol/L (ref 22–32)
CREATININE: 0.82 mg/dL (ref 0.44–1.00)
Chloride: 106 mmol/L (ref 101–111)
GFR calc non Af Amer: >60 mL/min (ref >60)
GLUCOSE: 119 mg/dL — AB (ref 65–99)
Potassium: 3.6 mmol/L (ref 3.5–5.1)
SODIUM: 138 mmol/L (ref 135–145)
TOTAL PROTEIN: 7.9 g/dL (ref 6.5–8.1)

## 2016-06-20 LAB — TYPE AND SCREEN
ABO/RH(D): A POS
ANTIBODY SCREEN: NEGATIVE

## 2016-06-20 MED ORDER — ONDANSETRON HCL 4 MG/2ML IJ SOLN
INTRAMUSCULAR | Status: AC
  Filled 2016-06-20: qty 2

## 2016-06-20 MED ORDER — ONDANSETRON HCL 4 MG/2ML IJ SOLN
4.0000 mg | Freq: Once | INTRAMUSCULAR | Status: AC
  Administered 2016-06-20: 4 mg via INTRAVENOUS

## 2016-06-20 NOTE — Discharge Instructions (Signed)
Gastrointestinal Bleeding °Gastrointestinal bleeding is bleeding somewhere along the path that food travels through the body (digestive tract). This path is anywhere between the mouth and the opening of the butt (anus). You may have blood in your throw up (vomit) or in your poop (stools). If there is a lot of bleeding, you may need to stay in the hospital. °HOME CARE °· Only take medicine as told by your doctor. °· Eat foods with fiber such as whole grains, fruits, and vegetables. You can also try eating 1 to 3 prunes a day. °· Drink enough fluids to keep your pee (urine) clear or pale yellow. °GET HELP RIGHT AWAY IF:  °· Your bleeding gets worse. °· You feel dizzy, weak, or you pass out (faint). °· You have bad cramps in your back or belly (abdomen). °· You have large blood clumps (clots) in your poop. °· Your problems are getting worse. °MAKE SURE YOU:  °· Understand these instructions. °· Will watch your condition. °· Will get help right away if you are not doing well or get worse. °  °This information is not intended to replace advice given to you by your health care provider. Make sure you discuss any questions you have with your health care provider. °  °Document Released: 09/22/2008 Document Revised: 11/30/2012 Document Reviewed: 06/03/2015 °Elsevier Interactive Patient Education ©2016 [employer] ° °

## 2016-06-20 NOTE — ED Provider Notes (Signed)
[facility] Emergency Department Provider Note   ____________________________________________  Time seen: Approximately [id number] PM  I have reviewed the triage vital signs and the nursing notes.   HISTORY  Chief Complaint Rectal Bleeding   HPI Marissa Mullen is a 75 y.o. female with a history of diverticulosis as well as hypertension who is presenting to the emergency department today with gastrointestinal bleeding. The patient says that she had diarrhea last night and then has had multiple episodes of teaspoon tablespoon size blood clots from her rectum today. She says that over the past 2-1/2 hours since being in the emergency department she is only had 2 episodes with 1-2 teaspoon-sized blood clots. She denies any pain. Denies any nausea or vomiting. Said that she was diagnosed with diverticulosis on a colonoscopy several years ago. She takes aspirin but no other anticoagulants.   Past Medical History  Diagnosis Date  . Hypertension   . GERD (gastroesophageal reflux disease)   . Arthritis   . Hepatitis     "Age 49"  . Hypercholesterolemia     Patient Active Problem List   Diagnosis Date Noted  . 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 Abdominal hysterectomy    . Partial knee arthroplasty Left 01/23/2016    Procedure: UNICOMPARTMENTAL KNEE;  Surgeon: Corky Mull, MD;  Location: [facility] ORS;  Service: Orthopedics;  Laterality: Left;    Current Outpatient Rx  Name  Route  Sig  Dispense  Refill  . acetaminophen (TYLENOL 8 HOUR) 650 MG CR tablet   Oral   Take 1,300 mg by mouth every 8 (eight) hours as needed for pain. Reported on 01/23/2016         . amLODipine (NORVASC) 10 MG tablet   Oral   Take 10 mg by mouth daily.         Marland Kitchen aspirin 325 MG tablet   Oral   Take 162 mg by mouth daily.         Marland Kitchen atenolol (TENORMIN) 25 MG tablet   Oral   Take 12.5 mg by mouth daily.        . benazepril (LOTENSIN) 40 MG tablet   Oral   Take 40 mg by mouth daily.         . Calcium Carb-Cholecalciferol (CALCIUM 600+D) 600-800 MG-UNIT TABS   Oral   Take 2 tablets by mouth daily.         Marland Kitchen enoxaparin (LOVENOX) 40 MG/0.4ML injection   Subcutaneous   Inject 0.4 mLs (40 mg total) into the skin daily.   14 Syringe   0   . Multiple Vitamins-Minerals (WOMENS MULTIVITAMIN PLUS) TABS   Oral   Take 1 tablet by mouth daily.         . ondansetron (ZOFRAN-ODT) 4 MG disintegrating tablet   Oral   Take 1 tablet (4 mg total) by mouth every 8 (eight) hours as needed for nausea or vomiting.   20 tablet   0   . oxyCODONE (OXY IR/ROXICODONE) 5 MG immediate release tablet   Oral   Take 1-2 tablets (5-10 mg total) by mouth every 4 (four) hours as needed for breakthrough pain.   60 tablet   0   . pantoprazole (PROTONIX) 40 MG tablet   Oral   Take 40 mg by mouth daily.         . potassium chloride SA (K-DUR,KLOR-CON) 20 MEQ tablet  Oral   Take 10 mEq by mouth 2 (two) times daily.          . simvastatin (ZOCOR) 20 MG tablet   Oral   Take 20 mg by mouth daily.         Marland Kitchen sulfamethoxazole-trimethoprim (BACTRIM DS,SEPTRA DS) 800-160 MG tablet   Oral   Take 2 tablets by mouth 2 (two) times daily.         . traMADol (ULTRAM) 50 MG tablet   Oral   Take 1-2 tablets (50-100 mg total) by mouth every 6 (six) hours as needed.   40 tablet   0     Allergies Oxycodone  Family History  Problem Relation Age of Onset  . Breast cancer Daughter 49  . Diabetes Mother     Social History Social History  Substance Use Topics  . Smoking status: Former Smoker -- 0.50 packs/day    Types: Cigarettes  . Smokeless tobacco: Never Used  . Alcohol Use: No    Review of Systems Constitutional: No fever/chills Eyes: No visual changes. ENT: No sore throat. Cardiovascular: Denies chest pain. Respiratory: Denies shortness of breath. Gastrointestinal: No abdominal  pain.  No nausea, no vomiting.  No constipation. Genitourinary: Negative for dysuria. Musculoskeletal: Negative for back pain. Skin: Negative for rash. Neurological: Negative for headaches, focal weakness or numbness.  10-point ROS otherwise negative.  ____________________________________________   PHYSICAL EXAM:  VITAL SIGNS: ED Triage Vitals  Enc Vitals Group     BP 06/20/16 1624 142/96 mmHg     Pulse Rate 06/20/16 1624 11     Resp 06/20/16 1624 18     Temp 06/20/16 1624 98.5 F (36.9 C)     Temp Source 06/20/16 1624 Oral     SpO2 06/20/16 1624 96 %     Weight 06/20/16 1624 175 lb (79.379 kg)     Height 06/20/16 1624 5\' 4"  (1.626 m)     Head Cir --      Peak Flow --      Pain Score --      Pain Loc --      Pain Edu? --      Excl. in [place]? --     Constitutional: Alert and oriented. Well appearing and in no acute distress. Eyes: Conjunctivae are normal. PERRL. EOMI. Head: Atraumatic. Nose: No congestion/rhinnorhea. Mouth/Throat: Mucous membranes are moist. Neck: No stridor.   Cardiovascular: Normal rate, regular rhythm. Grossly normal heart sounds. Respiratory: Normal respiratory effort.  No retractions. Lungs CTAB. Gastrointestinal: Soft and nontender. No distention.No CVA tenderness.  Rectal exam with 1 small, nonenlarged hemorrhoid at 10:00. Digital exam with a small amount of maroon blood on glove. Musculoskeletal: No lower extremity tenderness nor edema.  No joint effusions. Neurologic:  Normal speech and language. No gross focal neurologic deficits are appreciated.  Skin:  Skin is warm, dry and intact. No rash noted. Psychiatric: Mood and affect are normal. Speech and behavior are normal.  ____________________________________________   LABS (all labs ordered are listed, but only abnormal results are displayed)  Labs Reviewed  COMPREHENSIVE METABOLIC PANEL - Abnormal; Notable for the following:    Glucose, Bld 119 (*)    Total Bilirubin 1.5 (*)    All other  components within normal limits  CBC - Abnormal; Notable for the following:    Platelets 148 (*)    All other components within normal limits  CBC  TYPE AND SCREEN   ____________________________________________  EKG   ____________________________________________  RADIOLOGY  ____________________________________________   PROCEDURES   ____________________________________________   INITIAL IMPRESSION / ASSESSMENT AND PLAN / ED COURSE  Pertinent labs & imaging results that were available during my care of the patient were reviewed by me and considered in my medical decision making (see chart for details).  ----------------------------------------- 7:33 PM on 06/20/2016 -----------------------------------------  Patient with very reassuring blood work. Discussed the case with Dr. Hilarie Fredrickson of the on-call gastroenterology group. We discussed that as long as the patient has a stable hemoglobin over 6 hours and does not have any more episodes of bleeding she will be cleared for discharge for outpatient follow-up. However, she'll be instructed to return immediately for any further episodes of bleeding or any other worsening or concerning symptoms such as lightheadedness or feelings that she would pass out. This plan was exposed when the patient as well as her family and they're understanding of this plan and preferred. She is not had any further episodes of bright red blood per rectum since the initial H&P.  ----------------------------------------- 10:41 PM on 06/20/2016 -----------------------------------------  Patient without any further episodes of bloody stool. Second hemoglobin returned with a level of 14.6. This is an insignificant drop from initial value of 15.3. The patient would like to go home at this time and I think that is appropriate. She knows return precautions and will come back to the emergency department for any worsening or concerning symptoms, especially any  further bleeding per rectum as well as any feelings of lightheadedness. She requested Dr. Gustavo Lah for follow-up and I'll give his contact information on her discharge instructions. Heart rate at this time is 80 bpm. ____________________________________________   FINAL CLINICAL IMPRESSION(S) / ED DIAGNOSES   Acute GI bleed. Resolved.   NEW MEDICATIONS STARTED DURING THIS VISIT:  New Prescriptions   No medications on file     Note:  This document was prepared using Dragon voice recognition software and may include unintentional dictation errors.    Orbie Pyo, MD 06/20/16 [phone]

## 2016-06-20 NOTE — ED Notes (Signed)
Patient c/o diarrhea since last night which was dark in color. Patient now states she has rectal bleeding with no stool and mostly clots.

## 2016-08-04 ENCOUNTER — Other Ambulatory Visit: Payer: Self-pay | Admitting: Family Medicine

## 2016-08-04 DIAGNOSIS — Z1231 Encounter for screening mammogram for malignant neoplasm of breast: Secondary | ICD-10-CM

## 2016-10-12 ENCOUNTER — Other Ambulatory Visit: Payer: Self-pay | Admitting: Family Medicine

## 2016-10-12 ENCOUNTER — Ambulatory Visit
Admission: RE | Admit: 2016-10-12 | Discharge: 2016-10-12 | Disposition: A | Discharge status: Home / Self Care | Payer: Medicare Other | Source: Ambulatory Visit | Attending: Family Medicine | Admitting: Family Medicine

## 2016-10-12 DIAGNOSIS — Z1231 Encounter for screening mammogram for malignant neoplasm of breast: Secondary | ICD-10-CM

## 2017-08-04 IMAGING — MR MR KNEE*L* W/O CM
5 series · 34 of 40 positions shown · non-contrast
Comparison: None available.

CLINICAL DATA: Left knee pain since February 2015. Pain has progressed
over time.

EXAM:
MRI OF THE LEFT KNEE WITHOUT CONTRAST
TECHNIQUE: Multiplanar, multisequence MR imaging of the knee was performed. No
intravenous contrast was administered.

[Series 3: PD fat-sat · axial · 3.0mm · 0.50mm/px · z∈[-41,+71]mm · 8 of 35 slices shown (1 of 3)]
[im 1/35]
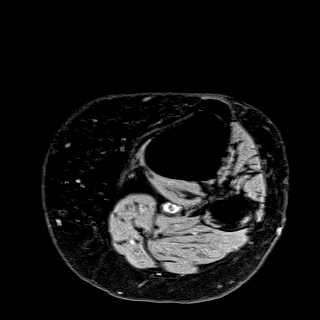
[im 5/35]
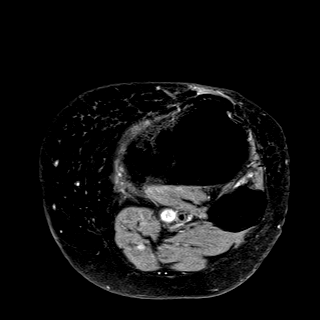
[im 10/35]
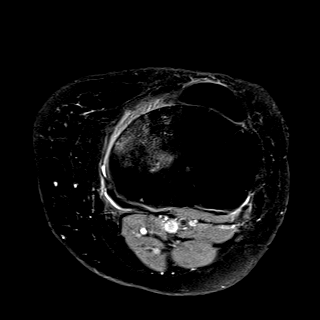
[im 15/35]
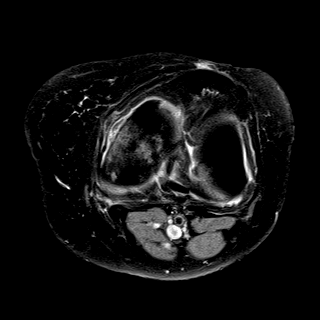
[im 20/35]
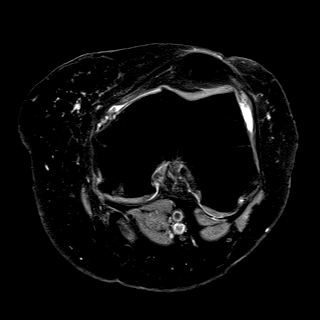
[im 25/35]
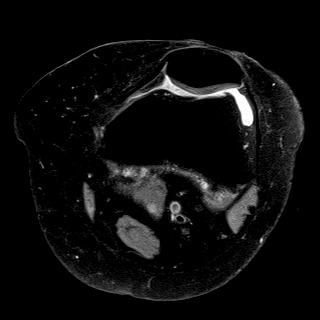
[im 30/35]
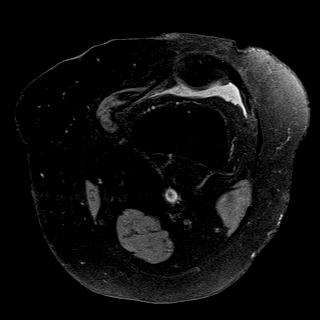
[im 35/35]
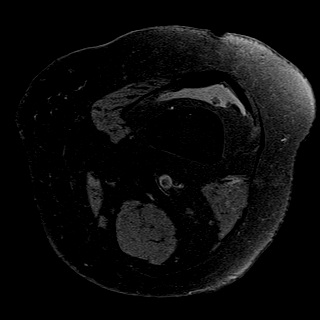

[Series 4: T1 · coronal · 3.0mm · 0.50mm/px · 1 of 31 slices shown]
[im 1/31]
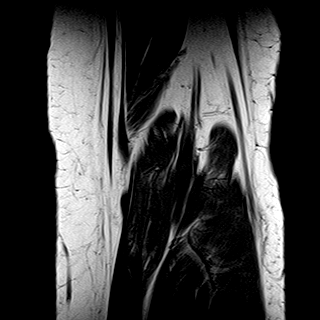

[Series 5: PD fat-sat · sagittal · 3.0mm · 0.50mm/px · 9 of 35 slices shown (2 of 3)]
[im 1/35]
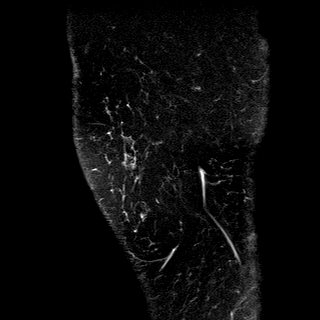
[im 5/35]
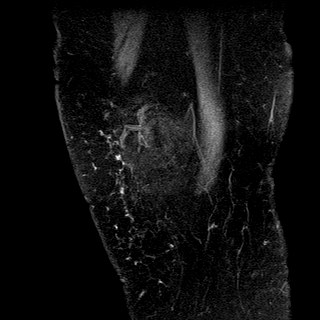
[im 9/35]
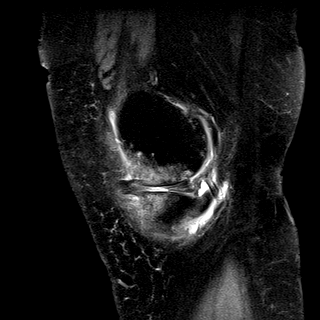
[im 13/35]
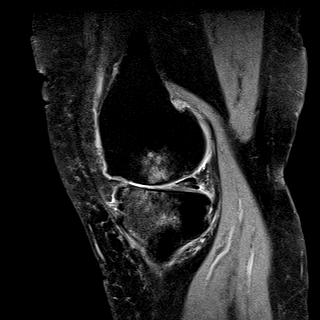
[im 18/35]
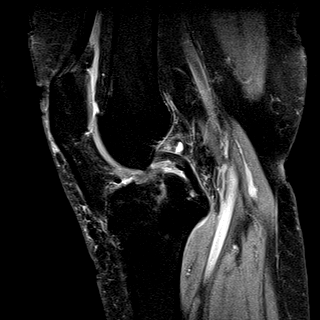
[im 22/35]
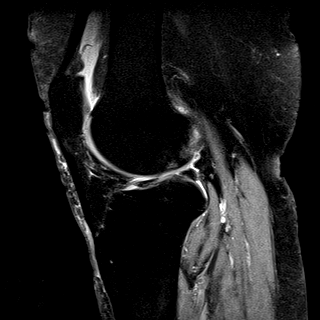
[im 26/35]
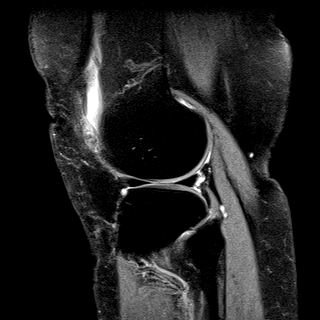
[im 30/35]
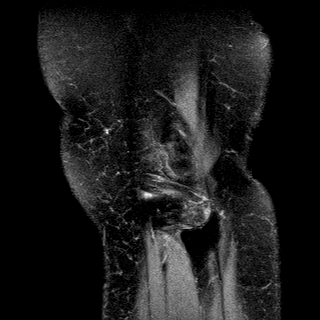
[im 35/35]
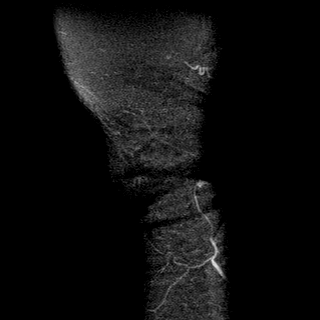

[Series 6: T2 fat-sat · coronal · 3.0mm · 0.31mm/px · 8 of 31 slices shown]
[im 1/31]
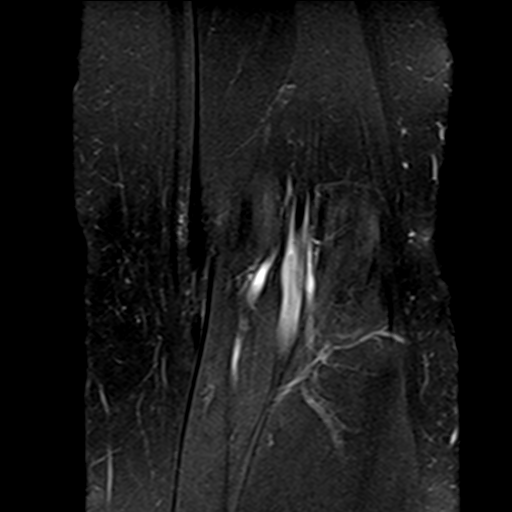
[im 5/31]
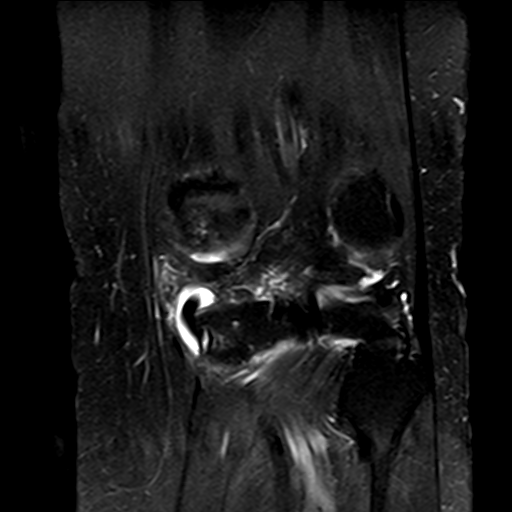
[im 9/31]
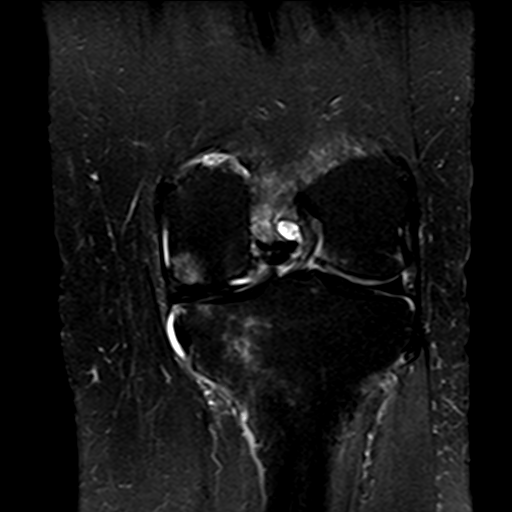
[im 13/31]
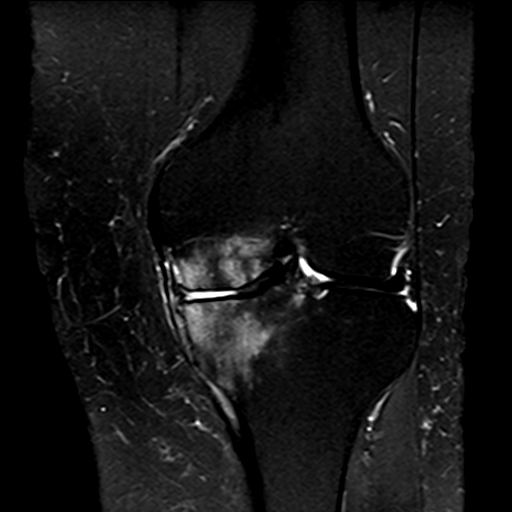
[im 18/31]
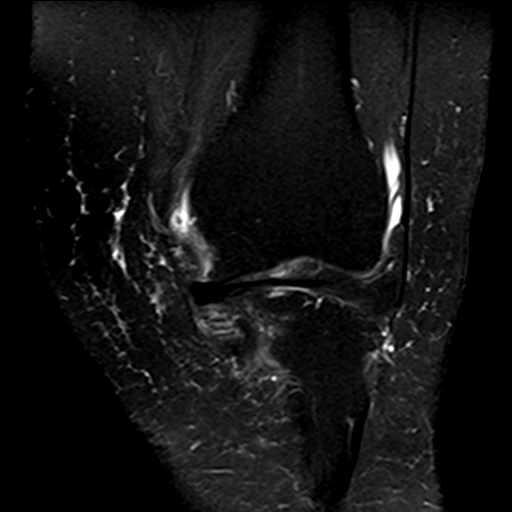
[im 22/31]
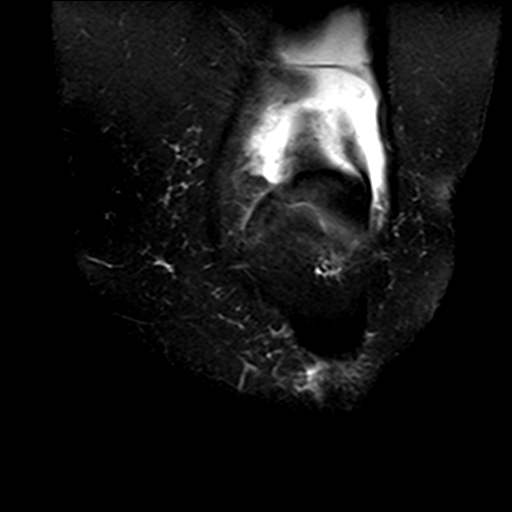
[im 26/31]
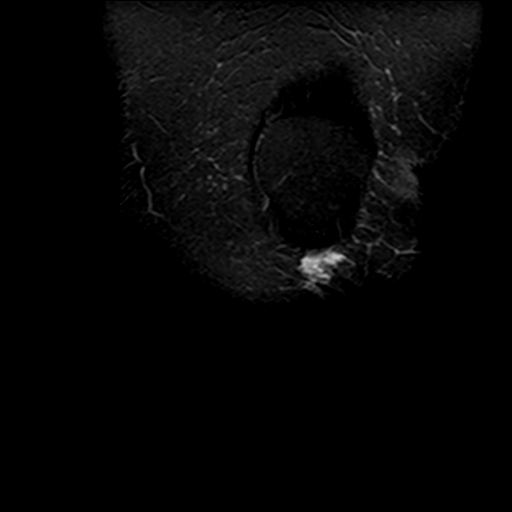
[im 31/31]
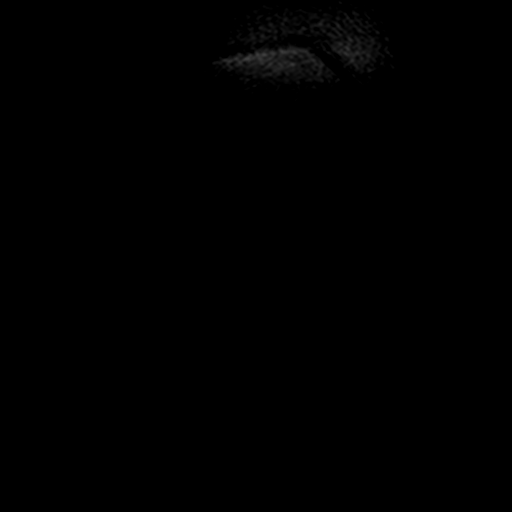

[Series 7: PD fat-sat · coronal · 3.0mm · 0.50mm/px · 8 of 31 slices shown (3 of 3)]
[im 1/31]
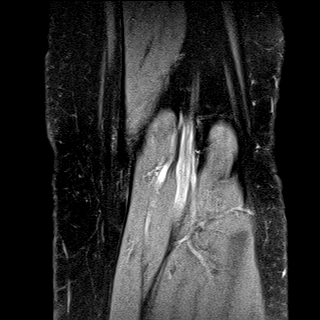
[im 5/31]
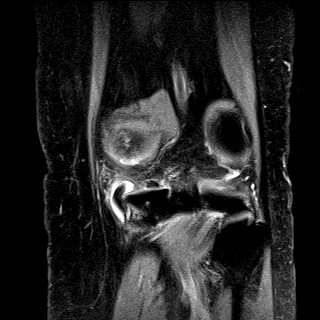
[im 9/31]
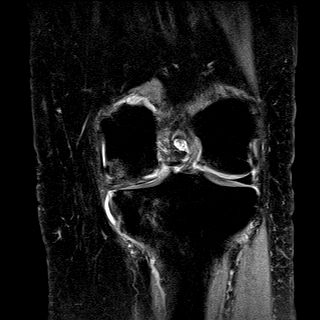
[im 13/31]
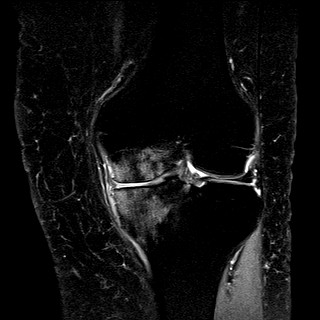
[im 18/31]
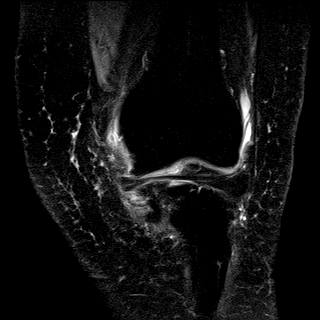
[im 22/31]
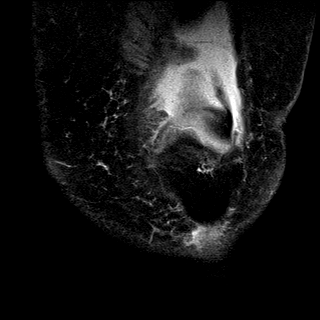
[im 26/31]
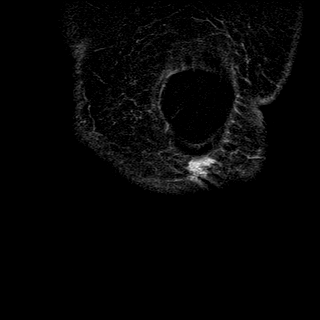
[im 31/31]
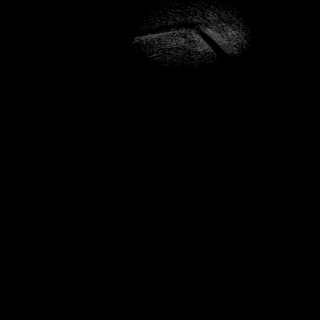

[34 of 40 positions shown; findings below may reference images not displayed]

FINDINGS: MENISCI

Medial meniscus: Degenerated and torn midbody region. The anterior
and posterior horns are intact.

Lateral meniscus:  Intact.

LIGAMENTS

Cruciates:  Intact.

Collaterals:  Intact.

CARTILAGE

Patellofemoral:  Mild degenerative chondrosis.

Medial: Severe degenerative chondrosis with full-thickness cartilage
loss, joint space narrowing, osteophytic spurring, subchondral
cystic change and marked stress edema in the tibia and femur.

Lateral:  Mild degenerative chondrosis.  Early osteophytic spurring.

Joint: Small to moderate-sized joint effusion. Patellar plica are
noted. Mild synovitis.

Popliteal Fossa: No popliteal mass or Baker's cyst. Mild
semimembranous bursitis.

Extensor Mechanism: The patella retinacular structures are intact
and the quadriceps and patellar tendons are intact.

Bones: Significant stress related process involving the medial
femoral condyle and medial tibial plateau.
IMPRESSION: 1. Significant medial compartment degenerative changes and marked
stress edema involving the tibia and femur
2. Mid body medial meniscus tear.
3. Intact ligamentous structures.
4. Small to moderate-sized joint effusion and mild synovitis.

## 2017-08-05 ENCOUNTER — Other Ambulatory Visit: Payer: Self-pay | Admitting: Family Medicine

## 2017-08-05 DIAGNOSIS — Z1231 Encounter for screening mammogram for malignant neoplasm of breast: Secondary | ICD-10-CM

## 2017-10-14 ENCOUNTER — Ambulatory Visit
Admission: RE | Admit: 2017-10-14 | Discharge: 2017-10-14 | Disposition: A | Discharge status: Home / Self Care | Payer: Medicare Other | Source: Ambulatory Visit | Attending: Family Medicine | Admitting: Family Medicine

## 2017-10-14 DIAGNOSIS — Z1231 Encounter for screening mammogram for malignant neoplasm of breast: Secondary | ICD-10-CM | POA: Diagnosis not present

## 2017-12-29 ENCOUNTER — Other Ambulatory Visit: Payer: Self-pay | Admitting: Family Medicine

## 2017-12-29 DIAGNOSIS — Z1231 Encounter for screening mammogram for malignant neoplasm of breast: Secondary | ICD-10-CM

## 2018-10-17 ENCOUNTER — Ambulatory Visit
Admission: RE | Admit: 2018-10-17 | Discharge: 2018-10-17 | Disposition: A | Discharge status: Home / Self Care | Payer: Medicare Other | Source: Ambulatory Visit | Attending: Family Medicine | Admitting: Family Medicine

## 2018-10-17 DIAGNOSIS — Z1231 Encounter for screening mammogram for malignant neoplasm of breast: Secondary | ICD-10-CM | POA: Diagnosis present

## 2018-12-22 ENCOUNTER — Encounter: Payer: Self-pay | Admitting: Emergency Medicine

## 2018-12-22 ENCOUNTER — Other Ambulatory Visit: Payer: Self-pay

## 2018-12-22 ENCOUNTER — Emergency Department
Admission: EM | Admit: 2018-12-22 | Discharge: 2018-12-23 | Disposition: A | Discharge status: Home / Self Care | Payer: Medicare Other | Attending: Emergency Medicine | Admitting: Emergency Medicine

## 2018-12-22 DIAGNOSIS — L509 Urticaria, unspecified: Secondary | ICD-10-CM

## 2018-12-22 DIAGNOSIS — I10 Essential (primary) hypertension: Secondary | ICD-10-CM | POA: Diagnosis not present

## 2018-12-22 DIAGNOSIS — R220 Localized swelling, mass and lump, head: Secondary | ICD-10-CM | POA: Insufficient documentation

## 2018-12-22 DIAGNOSIS — Z79899 Other long term (current) drug therapy: Secondary | ICD-10-CM | POA: Insufficient documentation

## 2018-12-22 DIAGNOSIS — Z87891 Personal history of nicotine dependence: Secondary | ICD-10-CM | POA: Insufficient documentation

## 2018-12-22 DIAGNOSIS — T7840XA Allergy, unspecified, initial encounter: Secondary | ICD-10-CM | POA: Diagnosis present

## 2018-12-22 DIAGNOSIS — Z7982 Long term (current) use of aspirin: Secondary | ICD-10-CM | POA: Diagnosis not present

## 2018-12-22 DIAGNOSIS — T783XXA Angioneurotic edema, initial encounter: Secondary | ICD-10-CM | POA: Diagnosis not present

## 2018-12-22 MED ORDER — EPINEPHRINE 0.3 MG/0.3ML IJ SOAJ
INTRAMUSCULAR | Status: AC
  Filled 2018-12-22: qty 0.3

## 2018-12-22 NOTE — ED Provider Notes (Signed)
[facility] Emergency Department Provider Note   ____________________________________________   First MD Initiated Contact with Patient 12/22/18 2356     (approximate)  I have reviewed the triage vital signs and the nursing notes.   HISTORY  Chief Complaint Allergic Reaction    HPI Marissa Mullen is a 77 y.o. female who presents to the ED from home with a chief complaint of allergic reaction. Patient ate chicken and shrimp at 8pm then went to bed. Awoke with diffuse hives and mild facial swelling. Took 50mg  PO Benadryl around 11pm. Denies difficulty swallowing or breathing. Denies chest pain, sob, abdominal pain, nausea, vomiting or diarrhea. States she has eaten the same meal previously. Denies new exposures to food, medicines (including OTCs).   Past Medical History:  Diagnosis Date  . Arthritis   . GERD (gastroesophageal reflux disease)   . Hepatitis    "Age 67"  . Hypercholesterolemia   . Hypertension     Patient Active Problem List   Diagnosis Date Noted  . 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 ABDOMINAL HYSTERECTOMY    . PARTIAL KNEE ARTHROPLASTY Left 01/23/2016   Procedure: UNICOMPARTMENTAL KNEE;  Surgeon: Corky Mull, MD;  Location: [facility] ORS;  Service: Orthopedics;  Laterality: Left;    Prior to Admission medications   Medication Sig Start Date End Date Taking? Authorizing Provider  acetaminophen (TYLENOL 8 HOUR) 650 MG CR tablet Take 1,300 mg by mouth every 8 (eight) hours as needed for pain. Reported on 01/23/2016    [provider]  amLODipine (NORVASC) 10 MG tablet Take 10 mg by mouth daily.    [provider]  aspirin 325 MG tablet Take 162 mg by mouth daily.    [provider]  atenolol (TENORMIN) 25 MG tablet Take 12.5 mg by mouth daily.    [provider]  benazepril (LOTENSIN) 40 MG tablet Take 40 mg by mouth daily.     [provider]  Calcium Carb-Cholecalciferol (CALCIUM 600+D) 600-800 MG-UNIT TABS Take 2 tablets by mouth daily.    [provider]  enoxaparin (LOVENOX) 40 MG/0.4ML injection Inject 0.4 mLs (40 mg total) into the skin daily. 01/24/16   Lattie Corns, PA-C  EPINEPHrine 0.3 mg/0.3 mL IJ SOAJ injection Inject 0.3 mLs (0.3 mg total) into the muscle once for 1 dose. 12/23/18 12/23/18  Paulette Blanch, MD  famotidine (PEPCID) 20 MG tablet Take 1 tablet (20 mg total) by mouth 2 (two) times daily. 12/23/18   Paulette Blanch, MD  Multiple Vitamins-Minerals (WOMENS MULTIVITAMIN PLUS) TABS Take 1 tablet by mouth daily.    [provider]  ondansetron (ZOFRAN-ODT) 4 MG disintegrating tablet Take 1 tablet (4 mg total) by mouth every 8 (eight) hours as needed for nausea or vomiting. 01/25/16   Hooten, Laurice Record, MD  oxyCODONE (OXY IR/ROXICODONE) 5 MG immediate release tablet Take 1-2 tablets (5-10 mg total) by mouth every 4 (four) hours as needed for breakthrough pain. 01/24/16   Lattie Corns, PA-C  pantoprazole (PROTONIX) 40 MG tablet Take 40 mg by mouth daily.    [provider]  potassium chloride SA (K-DUR,KLOR-CON) 20 MEQ tablet Take 10 mEq by mouth 2 (two) times daily.     [provider]  predniSONE (DELTASONE) 20 MG tablet 3 tablets daily x 4 days 12/23/18   Paulette Blanch, MD  simvastatin (ZOCOR) 20 MG tablet Take 20 mg  by mouth daily.    [provider]  sulfamethoxazole-trimethoprim (BACTRIM DS,SEPTRA DS) 800-160 MG tablet Take 2 tablets by mouth 2 (two) times daily.    [provider]  traMADol (ULTRAM) 50 MG tablet Take 1-2 tablets (50-100 mg total) by mouth every 6 (six) hours as needed. 01/24/16   Lattie Corns, PA-C    Allergies Oxycodone and Tramadol  Family History  Problem Relation Age of Onset  . Diabetes Mother   . Breast cancer Daughter 49    Social History Social History   Tobacco Use  . Smoking status: Former  Smoker    Packs/day: 0.50    Types: Cigarettes  . Smokeless tobacco: Never Used  Substance Use Topics  . Alcohol use: No  . Drug use: No    Review of Systems  Constitutional: No fever/chills Eyes: No visual changes. ENT: Positive for facial swelling.  No sore throat. Cardiovascular: Denies chest pain. Respiratory: Denies shortness of breath. Gastrointestinal: No abdominal pain.  No nausea, no vomiting.  No diarrhea.  No constipation. Genitourinary: Negative for dysuria. Musculoskeletal: Negative for back pain. Skin: Positive for rash. Neurological: Negative for headaches, focal weakness or numbness.   ____________________________________________   PHYSICAL EXAM:  VITAL SIGNS: ED Triage Vitals [12/22/18 2354]  Enc Vitals Group     BP      Pulse      Resp      Temp      Temp src      SpO2      Weight 175 lb (79.4 kg)     Height 5\' 4"  (1.626 m)     Head Circumference      Peak Flow      Pain Score 0     Pain Loc      Pain Edu?      Excl. in [place]?     Constitutional: Alert and oriented. Well appearing and in no acute distress. Eyes: Conjunctivae are normal. PERRL. EOMI. mild swelling under eyelids. Head: Atraumatic.  Nose: No congestion/rhinnorhea. Mouth/Throat: Mucous membranes are moist.  Mild upper lip swelling.  No tongue swelling.  There is no hoarse or muffled voice.  There is no drooling. Neck: No stridor.  Soft submental space. Cardiovascular: Normal rate, regular rhythm. Grossly normal heart sounds.  Good peripheral circulation. Respiratory: Normal respiratory effort.  No retractions. Lungs CTAB.  No wheezing. Gastrointestinal: Soft and nontender. No distention. No abdominal bruits. No CVA tenderness. Musculoskeletal: No lower extremity tenderness nor edema.  No joint effusions. Neurologic:  Normal speech and language. No gross focal neurologic deficits are appreciated. No gait instability. Skin:  Skin is warm, dry and intact.  Diffuse urticaria  noted. Psychiatric: Mood and affect are normal. Speech and behavior are normal.  ____________________________________________   LABS (all labs ordered are listed, but only abnormal results are displayed)  Labs Reviewed - No data to display ____________________________________________  EKG  ED ECG REPORT I, Bernhardt Riemenschneider J, the attending physician, personally viewed and interpreted this ECG.   Date: 12/23/2018  EKG Time: 2358  Rate: 94  Rhythm: normal EKG, normal sinus rhythm  Axis: Normal  Intervals:none  ST&T Change: Nonspecific  ____________________________________________  RADIOLOGY  ED MD interpretation: None  Official radiology report(s): No results found.  ____________________________________________   PROCEDURES  Procedure(s) performed: None  Procedures  Critical Care performed: Yes, see critical care note(s)  ____________________________________________   INITIAL IMPRESSION / ASSESSMENT AND PLAN / ED COURSE  As part of my medical decision making, I reviewed  the following data within the [place] History obtained from family, Nursing notes reviewed and incorporated, EKG interpreted, Old chart reviewed and Notes from prior ED visits   76 year old female who presents with acute allergic reaction.  Will administer IV cocktail to include EpiPen, 25 mg of Benadryl, 125 mg Solu-Medrol, 20 mg Pepcid, IV fluids and will monitor for minimum of 3 hours in the emergency department.  Clinical Course as of Dec 23 421  Fri Dec 23, 2018  0152 Hives resolved.  No airway distress.  Patient eager for discharge home.  Will monitor for 1 additional hour.   [JS]  0242 No hives.  No airway distress.  Room air saturations 98%.  Strict return precautions given.  Patient and sister verbalize understanding and agree with plan of care.   [JS]    Clinical Course User Index [JS] Paulette Blanch, MD     ____________________________________________   FINAL  CLINICAL IMPRESSION(S) / ED DIAGNOSES  Final diagnoses:  Allergic reaction, initial encounter  Urticaria  Angioedema, initial encounter     ED Discharge Orders         Ordered    predniSONE (DELTASONE) 20 MG tablet     12/23/18 0200    EPINEPHrine 0.3 mg/0.3 mL IJ SOAJ injection   Once     12/23/18 0200    famotidine (PEPCID) 20 MG tablet  2 times daily     12/23/18 0200           Note:  This document was prepared using Dragon voice recognition software and may include unintentional dictation errors.   Paulette Blanch, MD 12/23/18 [phone]

## 2018-12-22 NOTE — ED Triage Notes (Signed)
Pt is laughing and talking in room, denies any shob, NAD.

## 2018-12-22 NOTE — ED Triage Notes (Signed)
Patient ambulatory to triage with steady gait, without difficulty or distress noted; redness/swelling to face with generalized itching that began after eating combination chicken/shrimp; took 2 benadryl 69min PTA

## 2018-12-23 MED ORDER — SODIUM CHLORIDE 0.9 % IV BOLUS
1000.0000 mL | Freq: Once | INTRAVENOUS | Status: AC
  Administered 2018-12-23: 1000 mL via INTRAVENOUS

## 2018-12-23 MED ORDER — FAMOTIDINE IN NACL 20-0.9 MG/50ML-% IV SOLN
20.0000 mg | Freq: Once | INTRAVENOUS | Status: AC
  Administered 2018-12-23: 20 mg via INTRAVENOUS
  Filled 2018-12-23: qty 50

## 2018-12-23 MED ORDER — FAMOTIDINE 20 MG PO TABS: 20.0000 mg | ORAL_TABLET | Freq: Two times a day (BID) | ORAL | 0 refills | Status: DC

## 2018-12-23 MED ORDER — EPINEPHRINE 0.3 MG/0.3ML IJ SOAJ
0.3000 mg | Freq: Once | INTRAMUSCULAR | Status: AC
  Administered 2018-12-23: 0.3 mg via INTRAMUSCULAR

## 2018-12-23 MED ORDER — EPINEPHRINE 0.3 MG/0.3ML IJ SOAJ: 0.3000 mg | Freq: Once | INTRAMUSCULAR | 0 refills | Status: AC

## 2018-12-23 MED ORDER — METHYLPREDNISOLONE SODIUM SUCC 125 MG IJ SOLR
125.0000 mg | Freq: Once | INTRAMUSCULAR | Status: AC
  Administered 2018-12-23: 125 mg via INTRAVENOUS
  Filled 2018-12-23: qty 2

## 2018-12-23 MED ORDER — PREDNISONE 20 MG PO TABS: ORAL_TABLET | ORAL | 0 refills | Status: DC

## 2018-12-23 MED ORDER — DIPHENHYDRAMINE HCL 50 MG/ML IJ SOLN
25.0000 mg | Freq: Once | INTRAMUSCULAR | Status: AC
  Administered 2018-12-23: 25 mg via INTRAVENOUS
  Filled 2018-12-23: qty 1

## 2018-12-23 NOTE — Discharge Instructions (Signed)
1. Take the following medicines for the next 4 days: Prednisone 60mg daily Pepcid 20mg twice daily 2. Take Benadryl as needed for itching. 3. Use Epi-Pen in case of acute, life-threatening allergic reaction. 4. Return to the ER for worsening symptoms, persistent vomiting, difficulty breathing or other concerns.  

## 2018-12-23 NOTE — ED Notes (Signed)
Pt given ice water per verbal okay by Dr Beather Arbour.

## 2019-01-23 NOTE — ED Provider Notes (Signed)
-----------------------------------------   6:44 AM on 01/23/2019 -----------------------------------------  Addendum to 12/22/2018  CRITICAL CARE Performed by: Paulette Blanch   Total critical care time: 45 minutes  Critical care time was exclusive of separately billable procedures and treating other patients.  Critical care was necessary to treat or prevent imminent or life-threatening deterioration.  Critical care was time spent personally by me on the following activities: development of treatment plan with patient and/or surrogate as well as nursing, discussions with consultants, evaluation of patient's response to treatment, examination of patient, obtaining history from patient or surrogate, ordering and performing treatments and interventions, ordering and review of laboratory studies, ordering and review of radiographic studies, pulse oximetry and re-evaluation of patient's condition.   Paulette Blanch, MD 01/23/19 [phone]

## 2019-07-18 ENCOUNTER — Other Ambulatory Visit: Payer: Self-pay | Admitting: Family Medicine

## 2019-07-18 DIAGNOSIS — Z1231 Encounter for screening mammogram for malignant neoplasm of breast: Secondary | ICD-10-CM

## 2019-10-23 ENCOUNTER — Ambulatory Visit
Admission: RE | Admit: 2019-10-23 | Discharge: 2019-10-23 | Disposition: A | Discharge status: Home / Self Care | Payer: Medicare Other | Source: Ambulatory Visit | Attending: Family Medicine | Admitting: Family Medicine

## 2019-10-23 DIAGNOSIS — Z1231 Encounter for screening mammogram for malignant neoplasm of breast: Secondary | ICD-10-CM | POA: Diagnosis present

## 2020-03-14 ENCOUNTER — Other Ambulatory Visit: Payer: Self-pay

## 2020-03-14 ENCOUNTER — Emergency Department: Payer: Medicare Other

## 2020-03-14 ENCOUNTER — Inpatient Hospital Stay
Admission: EM | Admit: 2020-03-14 | Discharge: 2020-03-16 | DRG: 395 | Disposition: A | Discharge status: Home / Self Care | Payer: Medicare Other | Attending: Internal Medicine | Admitting: Internal Medicine

## 2020-03-14 ENCOUNTER — Encounter: Payer: Self-pay | Admitting: Emergency Medicine

## 2020-03-14 ENCOUNTER — Other Ambulatory Visit: Admission: RE | Admit: 2020-03-14 | Payer: Medicare Other | Source: Ambulatory Visit

## 2020-03-14 DIAGNOSIS — E7800 Pure hypercholesterolemia, unspecified: Secondary | ICD-10-CM | POA: Diagnosis present

## 2020-03-14 DIAGNOSIS — K648 Other hemorrhoids: Secondary | ICD-10-CM | POA: Diagnosis present

## 2020-03-14 DIAGNOSIS — Z20822 Contact with and (suspected) exposure to covid-19: Secondary | ICD-10-CM | POA: Diagnosis present

## 2020-03-14 DIAGNOSIS — Z87891 Personal history of nicotine dependence: Secondary | ICD-10-CM | POA: Diagnosis not present

## 2020-03-14 DIAGNOSIS — Z86010 Personal history of colonic polyps: Secondary | ICD-10-CM | POA: Diagnosis not present

## 2020-03-14 DIAGNOSIS — E785 Hyperlipidemia, unspecified: Secondary | ICD-10-CM

## 2020-03-14 DIAGNOSIS — Z96652 Presence of left artificial knee joint: Secondary | ICD-10-CM | POA: Diagnosis present

## 2020-03-14 DIAGNOSIS — Z833 Family history of diabetes mellitus: Secondary | ICD-10-CM

## 2020-03-14 DIAGNOSIS — E860 Dehydration: Secondary | ICD-10-CM | POA: Diagnosis present

## 2020-03-14 DIAGNOSIS — K559 Vascular disorder of intestine, unspecified: Secondary | ICD-10-CM | POA: Diagnosis present

## 2020-03-14 DIAGNOSIS — I700 Atherosclerosis of aorta: Secondary | ICD-10-CM | POA: Diagnosis present

## 2020-03-14 DIAGNOSIS — K625 Hemorrhage of anus and rectum: Secondary | ICD-10-CM

## 2020-03-14 DIAGNOSIS — Z79899 Other long term (current) drug therapy: Secondary | ICD-10-CM

## 2020-03-14 DIAGNOSIS — K219 Gastro-esophageal reflux disease without esophagitis: Secondary | ICD-10-CM | POA: Diagnosis present

## 2020-03-14 DIAGNOSIS — K635 Polyp of colon: Secondary | ICD-10-CM | POA: Diagnosis present

## 2020-03-14 DIAGNOSIS — K449 Diaphragmatic hernia without obstruction or gangrene: Secondary | ICD-10-CM | POA: Diagnosis present

## 2020-03-14 DIAGNOSIS — K529 Noninfective gastroenteritis and colitis, unspecified: Secondary | ICD-10-CM | POA: Diagnosis present

## 2020-03-14 DIAGNOSIS — K5730 Diverticulosis of large intestine without perforation or abscess without bleeding: Secondary | ICD-10-CM | POA: Diagnosis present

## 2020-03-14 DIAGNOSIS — I10 Essential (primary) hypertension: Secondary | ICD-10-CM

## 2020-03-14 DIAGNOSIS — K640 First degree hemorrhoids: Secondary | ICD-10-CM | POA: Diagnosis present

## 2020-03-14 DIAGNOSIS — Z7982 Long term (current) use of aspirin: Secondary | ICD-10-CM

## 2020-03-14 LAB — COMPREHENSIVE METABOLIC PANEL
ALT: 29 U/L (ref 0–44)
AST: 32 U/L (ref 15–41)
Albumin: 4.2 g/dL (ref 3.5–5.0)
Alkaline Phosphatase: 73 U/L (ref 38–126)
Anion gap: 10 (ref 5–15)
BUN: 19 mg/dL (ref 8–23)
CO2: 26 mmol/L (ref 22–32)
Calcium: 10.2 mg/dL (ref 8.9–10.3)
Chloride: 102 mmol/L (ref 98–111)
Creatinine, Ser: 0.98 mg/dL (ref 0.44–1.00)
GFR calc Af Amer: >60 mL/min (ref >60)
GFR calc non Af Amer: 56 mL/min — ABNORMAL LOW (ref >60)
Glucose, Bld: 170 mg/dL — ABNORMAL HIGH (ref 70–99)
Potassium: 4.5 mmol/L (ref 3.5–5.1)
Sodium: 138 mmol/L (ref 135–145)
Total Bilirubin: 1.9 mg/dL — ABNORMAL HIGH (ref 0.3–1.2)
Total Protein: 7.7 g/dL (ref 6.5–8.1)

## 2020-03-14 LAB — HEMOGLOBIN AND HEMATOCRIT, BLOOD
HCT: 41.5 % (ref 36.0–46.0)
Hemoglobin: 14.1 g/dL (ref 12.0–15.0)

## 2020-03-14 LAB — LIPASE, BLOOD: Lipase: 25 U/L (ref 11–51)

## 2020-03-14 LAB — CBC
HCT: 44.2 % (ref 36.0–46.0)
Hemoglobin: 15 g/dL (ref 12.0–15.0)
MCH: 31.6 pg (ref 26.0–34.0)
MCHC: 33.9 g/dL (ref 30.0–36.0)
MCV: 93.2 fL (ref 80.0–100.0)
Platelets: 174 10*3/uL (ref 150–400)
RBC: 4.74 MIL/uL (ref 3.87–5.11)
RDW: 12.9 % (ref 11.5–15.5)
WBC: 12.3 10*3/uL — ABNORMAL HIGH (ref 4.0–10.5)
nRBC: 0 % (ref 0.0–0.2)

## 2020-03-14 LAB — TYPE AND SCREEN
ABO/RH(D): A POS
Antibody Screen: NEGATIVE

## 2020-03-14 LAB — LACTIC ACID, PLASMA: Lactic Acid, Venous: 2.5 mmol/L (ref 0.5–1.9)

## 2020-03-14 MED ORDER — METRONIDAZOLE IN NACL 5-0.79 MG/ML-% IV SOLN: 500.0000 mg | Freq: Once | INTRAVENOUS | Status: DC

## 2020-03-14 MED ORDER — METOCLOPRAMIDE HCL 5 MG/ML IJ SOLN
10.0000 mg | Freq: Once | INTRAMUSCULAR | Status: AC
  Administered 2020-03-14: 10 mg via INTRAVENOUS
  Filled 2020-03-14: qty 2

## 2020-03-14 MED ORDER — METRONIDAZOLE IN NACL 5-0.79 MG/ML-% IV SOLN
500.0000 mg | Freq: Three times a day (TID) | INTRAVENOUS | Status: DC
  Administered 2020-03-14 – 2020-03-16 (×5): 500 mg via INTRAVENOUS
  Filled 2020-03-14 (×11): qty 100

## 2020-03-14 MED ORDER — PANTOPRAZOLE SODIUM 40 MG IV SOLR
40.0000 mg | INTRAVENOUS | Status: DC
  Administered 2020-03-14 – 2020-03-15 (×2): 40 mg via INTRAVENOUS
  Filled 2020-03-14 (×2): qty 40

## 2020-03-14 MED ORDER — IOHEXOL 350 MG/ML SOLN
100.0000 mL | Freq: Once | INTRAVENOUS | Status: AC | PRN
  Administered 2020-03-14: 100 mL via INTRAVENOUS

## 2020-03-14 MED ORDER — CIPROFLOXACIN IN D5W 400 MG/200ML IV SOLN: 400.0000 mg | Freq: Once | INTRAVENOUS | Status: DC

## 2020-03-14 MED ORDER — CIPROFLOXACIN IN D5W 400 MG/200ML IV SOLN
400.0000 mg | Freq: Two times a day (BID) | INTRAVENOUS | Status: DC
  Administered 2020-03-14 – 2020-03-16 (×4): 400 mg via INTRAVENOUS
  Filled 2020-03-14 (×7): qty 200

## 2020-03-14 NOTE — ED Notes (Signed)
This RN placed non slip fall socks on pt, and pt ambulated to restroom with steady gait.

## 2020-03-14 NOTE — ED Triage Notes (Signed)
Pt presents to ED via POV, states at approx 1230 this morning she began having lower abdominal pain. Pt states went to the bathroom and had brown liquid stool. Pt states after going to the bathroom she noted nausea, and dry heaves, and noted bright red blood clots coming from her rectum. Pt states continued nausea upon arrival to ED.   Pt states has colonoscopy scheduled for Monday 3/22.  Pt noted to be actively dry heaving in triage, states allergy to Zofran.

## 2020-03-14 NOTE — ED Provider Notes (Signed)
[facility] Emergency Department Provider Note  ____________________________________________   First MD Initiated Contact with Patient 03/14/20 1604     (approximate)  I have reviewed the triage vital signs and the nursing notes.   HISTORY  Chief Complaint Abdominal Pain and Rectal Bleeding    HPI Marissa Mullen is a 79 y.o. female with hypertension, hyperlipidemia who comes in with concerns for GI bleed.  Patient stated that last night she started to have some left lower quadrant abdominal pain felt she did have a bowel movement but was unable to.  Then 45 minutes later she felt the same discomfort in which would have a bowel movement she had bright red blood per rectum.  She states that she is continued to have some bright red blood.  These are intermittent, nothing makes it better, nothing makes it worse.  Currently her pain is minimal.  She has a history of GI bleeding in the past in 2017 but was sent home due to stable hemoglobin.  Patient was scheduled for a colonoscopy to be done on Monday due to it just being one of her scheduled colonoscopies.          Past Medical History:  Diagnosis Date  . Arthritis   . GERD (gastroesophageal reflux disease)   . Hepatitis    "Age 74"  . Hypercholesterolemia   . Hypertension     Patient Active Problem List   Diagnosis Date Noted  . 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 ABDOMINAL HYSTERECTOMY    . PARTIAL KNEE ARTHROPLASTY Left 01/23/2016   Procedure: UNICOMPARTMENTAL KNEE;  Surgeon: Corky Mull, MD;  Location: [facility] ORS;  Service: Orthopedics;  Laterality: Left;    Prior to Admission medications   Medication Sig Start Date End Date Taking? Authorizing Provider  acetaminophen (TYLENOL 8 HOUR) 650 MG CR tablet Take 1,300 mg by mouth every 8 (eight) hours as needed for pain. Reported on 01/23/2016    [provider]    amLODipine (NORVASC) 10 MG tablet Take 10 mg by mouth daily.    [provider]  aspirin 325 MG tablet Take 162 mg by mouth daily.    [provider]  atenolol (TENORMIN) 25 MG tablet Take 12.5 mg by mouth daily.    [provider]  benazepril (LOTENSIN) 40 MG tablet Take 40 mg by mouth daily.    [provider]  Calcium Carb-Cholecalciferol (CALCIUM 600+D) 600-800 MG-UNIT TABS Take 2 tablets by mouth daily.    [provider]  enoxaparin (LOVENOX) 40 MG/0.4ML injection Inject 0.4 mLs (40 mg total) into the skin daily. 01/24/16   Lattie Corns, PA-C  famotidine (PEPCID) 20 MG tablet Take 1 tablet (20 mg total) by mouth 2 (two) times daily. 12/23/18   Paulette Blanch, MD  Multiple Vitamins-Minerals (WOMENS MULTIVITAMIN PLUS) TABS Take 1 tablet by mouth daily.    [provider]  ondansetron (ZOFRAN-ODT) 4 MG disintegrating tablet Take 1 tablet (4 mg total) by mouth every 8 (eight) hours as needed for nausea or vomiting. 01/25/16   Hooten, Laurice Record, MD  oxyCODONE (OXY IR/ROXICODONE) 5 MG immediate release tablet Take 1-2 tablets (5-10 mg total) by mouth every 4 (four) hours as needed for breakthrough pain. 01/24/16   Lattie Corns, PA-C  pantoprazole (PROTONIX) 40 MG tablet Take 40 mg by mouth daily.    [provider]  potassium chloride SA (  K-DUR,KLOR-CON) 20 MEQ tablet Take 10 mEq by mouth 2 (two) times daily.     [provider]  predniSONE (DELTASONE) 20 MG tablet 3 tablets daily x 4 days 12/23/18   Paulette Blanch, MD  simvastatin (ZOCOR) 20 MG tablet Take 20 mg by mouth daily.    [provider]  sulfamethoxazole-trimethoprim (BACTRIM DS,SEPTRA DS) 800-160 MG tablet Take 2 tablets by mouth 2 (two) times daily.    [provider]  traMADol (ULTRAM) 50 MG tablet Take 1-2 tablets (50-100 mg total) by mouth every 6 (six) hours as needed. 01/24/16   Lattie Corns, PA-C    Allergies Ondansetron,  Oxycodone, and Tramadol  Family History  Problem Relation Age of Onset  . Diabetes Mother   . Breast cancer Daughter 41    Social History Social History   Tobacco Use  . Smoking status: Former Smoker    Packs/day: 0.50    Types: Cigarettes  . Smokeless tobacco: Never Used  Substance Use Topics  . Alcohol use: No  . Drug use: No      Review of Systems Constitutional: No fever/chills Eyes: No visual changes. ENT: No sore throat. Cardiovascular: Denies chest pain. Respiratory: Denies shortness of breath. Gastrointestinal: Abdominal pain and bright red blood per rectum Genitourinary: Negative for dysuria. Musculoskeletal: Negative for back pain. Skin: Negative for rash. Neurological: Negative for headaches, focal weakness or numbness. All other ROS negative ____________________________________________   PHYSICAL EXAM:  VITAL SIGNS: ED Triage Vitals  Enc Vitals Group     BP 03/14/20 1145 128/64     Pulse Rate 03/14/20 1145 74     Resp 03/14/20 1145 20     Temp 03/14/20 1145 98.5 F (36.9 C)     Temp Source 03/14/20 1145 Oral     SpO2 03/14/20 1145 94 %     Weight 03/14/20 1141 170 lb (77.1 kg)     Height 03/14/20 1141 5\' 4"  (1.626 m)     Head Circumference --      Peak Flow --      Pain Score 03/14/20 1140 4     Pain Loc --      Pain Edu? --      Excl. in [place]? --     Constitutional: Alert and oriented. Well appearing and in no acute distress. Eyes: Conjunctivae are normal. EOMI. Head: Atraumatic. Nose: No congestion/rhinnorhea. Mouth/Throat: Mucous membranes are moist.   Neck: No stridor. Trachea Midline. FROM Cardiovascular: Normal rate, regular rhythm. Grossly normal heart sounds.  Good peripheral circulation. Respiratory: Normal respiratory effort.  No retractions. Lungs CTAB. Gastrointestinal: Soft with slight tenderness in the left lower quadrant.  No distention. No abdominal bruits.  Musculoskeletal: No lower extremity tenderness nor edema.  No  joint effusions. Neurologic:  Normal speech and language. No gross focal neurologic deficits are appreciated.  Skin:  Skin is warm, dry and intact. No rash noted. Psychiatric: Mood and affect are normal. Speech and behavior are normal. GU: Bright red blood per rectum  ____________________________________________   LABS (all labs ordered are listed, but only abnormal results are displayed)  Labs Reviewed  COMPREHENSIVE METABOLIC PANEL - Abnormal; Notable for the following components:      Result Value   Glucose, Bld 170 (*)    Total Bilirubin 1.9 (*)    GFR calc non Af Amer 56 (*)    All other components within normal limits  CBC - Abnormal; Notable for the following components:   WBC 12.3 (*)  All other components within normal limits  LIPASE, BLOOD  HEMOGLOBIN AND HEMATOCRIT, BLOOD  POC OCCULT BLOOD, ED  TYPE AND SCREEN   ____________________________________________   ED ECG REPORT I, Vanessa [place], the attending physician, personally viewed and interpreted this ECG.  EKG is normal sinus rate of 78, no ST elevations, no T wave inversions, normal intervals, occasional PVC ____________________________________________  RADIOLOGY   Official radiology report(s): CT Angio Abd/Pel W and/or Wo Contrast  Result Date: 03/14/2020 CLINICAL DATA:  GI bleed EXAM: CTA ABDOMEN AND PELVIS WITHOUT AND WITH CONTRAST TECHNIQUE: Multidetector CT imaging of the abdomen and pelvis was performed using the standard protocol during bolus administration of intravenous contrast. Multiplanar reconstructed images and MIPs were obtained and reviewed to evaluate the vascular anatomy. CONTRAST:  129mL OMNIPAQUE IOHEXOL 350 MG/ML SOLN COMPARISON:  None. FINDINGS: VASCULAR Aorta: Normal caliber aorta without aneurysm, dissection, vasculitis or significant stenosis. Atherosclerotic changes are noted throughout the abdominal aorta. Celiac: Patent without evidence of aneurysm, dissection, vasculitis or  significant stenosis. SMA: Patent without evidence of aneurysm, dissection, vasculitis or significant stenosis. Renals: Both renal arteries are patent without evidence of aneurysm, dissection, vasculitis, fibromuscular dysplasia or significant stenosis. There are 2 renal arteries bilaterally. IMA: Patent without evidence of aneurysm, dissection, vasculitis or significant stenosis. Inflow: Patent without evidence of aneurysm, dissection, vasculitis or significant stenosis. Proximal Outflow: Bilateral common femoral and visualized portions of the superficial and profunda femoral arteries are patent without evidence of aneurysm, dissection, vasculitis or significant stenosis. Veins: No obvious venous abnormality within the limitations of this arterial phase study. Review of the MIP images confirms the above findings. NON-VASCULAR Lower chest: There is some branching mucous plugging involving the peripheral right lower lobe. There is a calcified granuloma at the left lung base.The heart size is normal. Hepatobiliary: The liver is normal. Normal gallbladder.There is no biliary ductal dilation. Pancreas: Normal contours without ductal dilatation. No peripancreatic fluid collection. Spleen: There is scattered calcifications throughout the spleen consistent with a prior granulomatous infection. Adrenals/Urinary Tract: --Adrenal glands: No adrenal hemorrhage. --Right kidney/ureter: No hydronephrosis or perinephric hematoma. --Left kidney/ureter: No hydronephrosis or perinephric hematoma. --Urinary bladder: Unremarkable. Stomach/Bowel: --Stomach/Duodenum: There is a moderate-sized hiatal hernia. --Small bowel: No dilatation or inflammation. --Colon: There is severe sigmoid diverticulosis. There is diffuse circumferential wall thickening throughout much of the sigmoid colon and descending colon. There is no abscess or free air. There is no evidence for active arterial extravasation. --Appendix: Normal. Lymphatic: --No  retroperitoneal lymphadenopathy. --No mesenteric lymphadenopathy. --No pelvic or inguinal lymphadenopathy. Reproductive: There is a cystic structure involving the right lateral aspect of the vaginal cuff measuring approximately 2.5 cm. The patient is status post prior hysterectomy. Other: No ascites or free air. The abdominal wall is normal. Musculoskeletal. No acute displaced fractures. IMPRESSION: 1. Diffuse circumferential wall thickening the sigmoid colon and descending colon is concerning for infectious or inflammatory colitis. There is no evidence for active extravasation at this level. 2. No other acute abnormality identified within the abdomen or pelvis. 3. Cystic appearing mass involving the right lateral aspect of the vaginal cuff. Correlation with a nonemergent outpatient pelvic ultrasound is recommended. 4. Moderate-sized hiatal hernia. 5.  Aortic Atherosclerosis (ICD10-I70.0). Electronically Signed   By: Constance Holster M.D.   On: 03/14/2020 17:16    ____________________________________________   PROCEDURES  Procedure(s) performed (including Critical Care):  Procedures   ____________________________________________   INITIAL IMPRESSION / ASSESSMENT AND PLAN / ED COURSE  Marissa Mullen was evaluated in Emergency Department on 03/14/2020  for the symptoms described in the history of present illness. She was evaluated in the context of the global COVID-19 pandemic, which necessitated consideration that the patient might be at risk for infection with the SARS-CoV-2 virus that causes COVID-19. Institutional protocols and algorithms that pertain to the evaluation of patients at risk for COVID-19 are in a state of rapid change based on information released by regulatory bodies including the CDC and federal and state organizations. These policies and algorithms were followed during the patient's care in the ED.    Patient presents with rectal bleeding and left lower quadrant tenderness.   Patient already been in the ER for greater than 5 hours.  Will get repeat hemoglobin.  Will get CT scan to make sure that there is no obstruction given patient is nauseous as well, evaluate for any active bleeding given there is bright red blood per rectum.  Patient did have pain out of proportion to suggest mesenteric ischemia.  Will get labs to evaluate for anemia.  This could be secondary to diverticulosis versus hemorrhoids.  Hemoglobin is stable patient does have bright red blood per rectum so we will need to trend out the hemoglobin level.  CT angio without any active bleeding but does have diffuse sigmoid colon descending colon colitis.  Most likely infectious versus inflammatory.  Will start on Cipro and Flagyl.  Will add on lactate although I have lower suspicion for mesenteric ischemia given patient does not have pain on proportion of exam.  Will discuss with the hospital team for admission.  Patient's family was provided a copy of the reports they can follow-up on the nonemergent outpatient pelvic ultrasound  Discussed with the hospital team and they will admit patient     ____________________________________________   FINAL CLINICAL IMPRESSION(S) / ED DIAGNOSES   Final diagnoses:  Colitis  Rectal bleeding      MEDICATIONS GIVEN DURING THIS VISIT:  Medications  ciprofloxacin (CIPRO) IVPB 400 mg (has no administration in time range)  metroNIDAZOLE (FLAGYL) IVPB 500 mg (has no administration in time range)  metoCLOPramide (REGLAN) injection 10 mg (10 mg Intravenous Given 03/14/20 1630)  iohexol (OMNIPAQUE) 350 MG/ML injection 100 mL (100 mLs Intravenous Contrast Given 03/14/20 1632)     ED Discharge Orders    None       Note:  This document was prepared using Dragon voice recognition software and may include unintentional dictation errors.   Vanessa [place], MD 03/14/20 [phone]

## 2020-03-14 NOTE — H&P (Signed)
History and Physical    Marissa Mullen [id number] DOB: [birth year] DOA: 03/14/2020  PCP: Maryland Pink, MD  Patient coming from: home   Chief Complaint: rectal bleed, dry heave , abd pain  HPI: Marissa Mullen is a 79 y.o. female with medical history significant of Marissa Mullen is a 79 y.o. female with hypertension, hyperlipidemia, GERD, HTN presents with rectal bleeding  , dry heaving, and abdominal pain.Her symptoms started today. Was having abdominal pain, went to the bathroom and found with red blood and clot of blood in commode. No vomiting but with dry heaving. +chills, shakes, no fever. No dysuria or other sx.  Pt reports is suppose to see Dr. Alice Reichert , GI for cscope on monday  ED Course: in ER pt found wbc 12.3, tachycardiac, ct abd/pel- Diffuse circumferential wall thickening the sigmoid colon and descending colon is concerning for infectious or inflammatory colitis. There is no evidence for active extravasation at this Level. Was started on cipro and flagyl.   Review of Systems: All systems reviewed and otherwise negative.    Past Medical History:  Diagnosis Date  . Arthritis   . GERD (gastroesophageal reflux disease)   . Hepatitis    "Age 82"  . Hypercholesterolemia   . Hypertension     Past Surgical History:  Procedure Laterality Date  . ABDOMINAL HYSTERECTOMY    . PARTIAL KNEE ARTHROPLASTY Left 01/23/2016   Procedure: UNICOMPARTMENTAL KNEE;  Surgeon: Corky Mull, MD;  Location: [facility] ORS;  Service: Orthopedics;  Laterality: Left;     reports that she has quit smoking. Her smoking use included cigarettes. She smoked 0.50 packs per day. She has never used smokeless tobacco. She reports that she does not drink alcohol or use drugs.  Allergies  Allergen Reactions  . Ondansetron     Other reaction(s): Abdominal Pain  . Oxycodone Itching  . Tramadol Itching    Unknown     Family History  Problem Relation Age of Onset  . Diabetes Mother   . Breast  cancer Daughter 66     Prior to Admission medications   Medication Sig Start Date End Date Taking? Authorizing Provider  acetaminophen (TYLENOL 8 HOUR) 650 MG CR tablet Take 650-1,300 mg by mouth every 8 (eight) hours as needed for pain.    Yes [provider]  amLODipine (NORVASC) 10 MG tablet Take 10 mg by mouth daily.   Yes [provider]  aspirin 325 MG tablet Take 325 mg by mouth daily.    Yes [provider]  atenolol (TENORMIN) 25 MG tablet Take 25 mg by mouth daily.    Yes [provider]  benazepril (LOTENSIN) 40 MG tablet Take 40 mg by mouth daily.   Yes [provider]  pantoprazole (PROTONIX) 40 MG tablet Take 40 mg by mouth daily.   Yes [provider]  simvastatin (ZOCOR) 20 MG tablet Take 20 mg by mouth daily.   Yes [provider]  Calcium Carb-Cholecalciferol (CALCIUM 600+D) 600-800 MG-UNIT TABS Take 2 tablets by mouth daily.    [provider]  Multiple Vitamins-Minerals (WOMENS MULTIVITAMIN PLUS) TABS Take 1 tablet by mouth daily.    [provider]    Physical Exam: Vitals:   03/14/20 1141 03/14/20 1145 03/14/20 1502 03/14/20 1730  BP:  128/64 (!) 150/63 (!) 182/97  Pulse:  74 75 (!) 103  Resp:  20 18 20   Temp:  98.5 F (36.9 C)    TempSrc:  Oral  SpO2:  94% 99% 92%  Weight: 77.1 kg     Height: 5\' 4"  (1.626 m)       Constitutional: NAD, calm, comfortable Vitals:   03/14/20 1141 03/14/20 1145 03/14/20 1502 03/14/20 1730  BP:  128/64 (!) 150/63 (!) 182/97  Pulse:  74 75 (!) 103  Resp:  20 18 20   Temp:  98.5 F (36.9 C)    TempSrc:  Oral    SpO2:  94% 99% 92%  Weight: 77.1 kg     Height: 5\' 4"  (1.626 m)      Eyes: PERRL, lids and conjunctivae normal Neck: normal, supple Respiratory: clear to auscultation bilaterally, no wheezing, no crackles. Normal respiratory effort. Cardiovascular: Regular rate and rhythm, no murmurs / rubs / gallops. Abdomen: no tenderness, soft,  +bs, no rebound  LE:no edema, no cyanosis Skin: warm , dry Neurologic: CN 2-12 grossly intact.  Psychiatric: Normal judgment and insight. Alert and oriented x 3. Normal mood.    Labs on Admission: I have personally reviewed following labs and imaging studies  CBC: Recent Labs  Lab 03/14/20 1147  WBC 12.3*  HGB 15.0  HCT 44.2  MCV 93.2  PLT [id number]   Basic Metabolic Panel: Recent Labs  Lab 03/14/20 1147  NA 138  K 4.5  CL 102  CO2 26  GLUCOSE 170*  BUN 19  CREATININE 0.98  CALCIUM 10.2   GFR: Estimated Creatinine Clearance: 49.1 mL/min (by C-G formula based on SCr of 0.98 mg/dL). Liver Function Tests: Recent Labs  Lab 03/14/20 1147  AST 32  ALT 29  ALKPHOS 73  BILITOT 1.9*  PROT 7.7  ALBUMIN 4.2   Recent Labs  Lab 03/14/20 1147  LIPASE 25   No results for input(s): AMMONIA in the last 168 hours. Coagulation Profile: No results for input(s): INR, PROTIME in the last 168 hours. Cardiac Enzymes: No results for input(s): CKTOTAL, CKMB, CKMBINDEX, TROPONINI in the last 168 hours. BNP (last 3 results) No results for input(s): PROBNP in the last 8760 hours. HbA1C: No results for input(s): HGBA1C in the last 72 hours. CBG: No results for input(s): GLUCAP in the last 168 hours. Lipid Profile: No results for input(s): CHOL, HDL, LDLCALC, TRIG, CHOLHDL, LDLDIRECT in the last 72 hours. Thyroid Function Tests: No results for input(s): TSH, T4TOTAL, FREET4, T3FREE, THYROIDAB in the last 72 hours. Anemia Panel: No results for input(s): VITAMINB12, FOLATE, FERRITIN, TIBC, IRON, RETICCTPCT in the last 72 hours. Urine analysis:    Component Value Date/Time   COLORURINE YELLOW (A) 01/15/2016 1539   APPEARANCEUR HAZY (A) 01/15/2016 1539   LABSPEC 1.015 01/15/2016 1539   PHURINE 5.0 01/15/2016 1539   GLUCOSEU NEGATIVE 01/15/2016 1539   HGBUR NEGATIVE 01/15/2016 1539   BILIRUBINUR NEGATIVE 01/15/2016 1539   KETONESUR NEGATIVE 01/15/2016 1539   PROTEINUR NEGATIVE  01/15/2016 1539   NITRITE NEGATIVE 01/15/2016 1539   LEUKOCYTESUR 3+ (A) 01/15/2016 1539    Radiological Exams on Admission: CT Angio Abd/Pel W and/or Wo Contrast  Result Date: 03/14/2020 CLINICAL DATA:  GI bleed EXAM: CTA ABDOMEN AND PELVIS WITHOUT AND WITH CONTRAST TECHNIQUE: Multidetector CT imaging of the abdomen and pelvis was performed using the standard protocol during bolus administration of intravenous contrast. Multiplanar reconstructed images and MIPs were obtained and reviewed to evaluate the vascular anatomy. CONTRAST:  153mL OMNIPAQUE IOHEXOL 350 MG/ML SOLN COMPARISON:  None. FINDINGS: VASCULAR Aorta: Normal caliber aorta without aneurysm, dissection, vasculitis or significant stenosis. Atherosclerotic changes are noted throughout the abdominal aorta. Celiac: Patent  without evidence of aneurysm, dissection, vasculitis or significant stenosis. SMA: Patent without evidence of aneurysm, dissection, vasculitis or significant stenosis. Renals: Both renal arteries are patent without evidence of aneurysm, dissection, vasculitis, fibromuscular dysplasia or significant stenosis. There are 2 renal arteries bilaterally. IMA: Patent without evidence of aneurysm, dissection, vasculitis or significant stenosis. Inflow: Patent without evidence of aneurysm, dissection, vasculitis or significant stenosis. Proximal Outflow: Bilateral common femoral and visualized portions of the superficial and profunda femoral arteries are patent without evidence of aneurysm, dissection, vasculitis or significant stenosis. Veins: No obvious venous abnormality within the limitations of this arterial phase study. Review of the MIP images confirms the above findings. NON-VASCULAR Lower chest: There is some branching mucous plugging involving the peripheral right lower lobe. There is a calcified granuloma at the left lung base.The heart size is normal. Hepatobiliary: The liver is normal. Normal gallbladder.There is no biliary  ductal dilation. Pancreas: Normal contours without ductal dilatation. No peripancreatic fluid collection. Spleen: There is scattered calcifications throughout the spleen consistent with a prior granulomatous infection. Adrenals/Urinary Tract: --Adrenal glands: No adrenal hemorrhage. --Right kidney/ureter: No hydronephrosis or perinephric hematoma. --Left kidney/ureter: No hydronephrosis or perinephric hematoma. --Urinary bladder: Unremarkable. Stomach/Bowel: --Stomach/Duodenum: There is a moderate-sized hiatal hernia. --Small bowel: No dilatation or inflammation. --Colon: There is severe sigmoid diverticulosis. There is diffuse circumferential wall thickening throughout much of the sigmoid colon and descending colon. There is no abscess or free air. There is no evidence for active arterial extravasation. --Appendix: Normal. Lymphatic: --No retroperitoneal lymphadenopathy. --No mesenteric lymphadenopathy. --No pelvic or inguinal lymphadenopathy. Reproductive: There is a cystic structure involving the right lateral aspect of the vaginal cuff measuring approximately 2.5 cm. The patient is status post prior hysterectomy. Other: No ascites or free air. The abdominal wall is normal. Musculoskeletal. No acute displaced fractures. IMPRESSION: 1. Diffuse circumferential wall thickening the sigmoid colon and descending colon is concerning for infectious or inflammatory colitis. There is no evidence for active extravasation at this level. 2. No other acute abnormality identified within the abdomen or pelvis. 3. Cystic appearing mass involving the right lateral aspect of the vaginal cuff. Correlation with a nonemergent outpatient pelvic ultrasound is recommended. 4. Moderate-sized hiatal hernia. 5.  Aortic Atherosclerosis (ICD10-I70.0). Electronically Signed   By: Constance Holster M.D.   On: 03/14/2020 17:16    EKG: Independently reviewed. SR pvc  Assessment/Plan Active Problems:   Colitis    1. Colitis- infectious  v.s. inflammatory.  CT abn. See above Will place on cipro and metronidazole GI  Consult- as pt was suppose to have cscope on Monday Npo ivf Iv ppi Ck blood culture Stool culture  2.HTN- resume home meds, amlodipine, beta blocker. Hold acei for now to make sure doesn't become hypotensive.   3. DL - continue with statin  DVT prophylaxis: scd Code Status: full Family Communication: daughter at bedside was updated. Disposition Plan: back home when medically stable. Consults called: GI, Dr. Alice Reichert Admission status: inpatient as pt requires >2 [place] stays for the medical treatment .    Nolberto Hanlon MD [facility] Pager [phone]   If 7PM-7AM, please contact night-coverage [email or web address] Password [facility]  03/14/2020, 6:03 PM

## 2020-03-14 NOTE — ED Notes (Signed)
This RN assisted pt with ambulation to restroom. Pt ambulated independently and with steady gait. Respirations were equal and unlabored during ambulation, no signs of acute distress.

## 2020-03-15 LAB — BASIC METABOLIC PANEL
Anion gap: 8 (ref 5–15)
BUN: 19 mg/dL (ref 8–23)
CO2: 26 mmol/L (ref 22–32)
Calcium: 9.1 mg/dL (ref 8.9–10.3)
Chloride: 105 mmol/L (ref 98–111)
Creatinine, Ser: 0.79 mg/dL (ref 0.44–1.00)
GFR calc Af Amer: >60 mL/min (ref >60)
GFR calc non Af Amer: >60 mL/min (ref >60)
Glucose, Bld: 135 mg/dL — ABNORMAL HIGH (ref 70–99)
Potassium: 3.6 mmol/L (ref 3.5–5.1)
Sodium: 139 mmol/L (ref 135–145)

## 2020-03-15 LAB — CBC
HCT: 37.2 % (ref 36.0–46.0)
Hemoglobin: 12.6 g/dL (ref 12.0–15.0)
MCH: 31.8 pg (ref 26.0–34.0)
MCHC: 33.9 g/dL (ref 30.0–36.0)
MCV: 93.9 fL (ref 80.0–100.0)
Platelets: 140 10*3/uL — ABNORMAL LOW (ref 150–400)
RBC: 3.96 MIL/uL (ref 3.87–5.11)
RDW: 13.2 % (ref 11.5–15.5)
WBC: 12.7 10*3/uL — ABNORMAL HIGH (ref 4.0–10.5)
nRBC: 0 % (ref 0.0–0.2)

## 2020-03-15 LAB — LACTIC ACID, PLASMA: Lactic Acid, Venous: 1.8 mmol/L (ref 0.5–1.9)

## 2020-03-15 LAB — SARS CORONAVIRUS 2 (TAT 6-24 HRS): SARS Coronavirus 2: NEGATIVE

## 2020-03-15 MED ORDER — POLYETHYLENE GLYCOL 3350 17 GM/SCOOP PO POWD
1.0000 | Freq: Once | ORAL | Status: AC
  Administered 2020-03-15: 255 g via ORAL
  Filled 2020-03-15: qty 255

## 2020-03-15 MED ORDER — AMLODIPINE BESYLATE 5 MG PO TABS: 10.0000 mg | ORAL_TABLET | Freq: Every day | ORAL | Status: DC

## 2020-03-15 MED ORDER — SODIUM CHLORIDE 0.9 % IV SOLN: INTRAVENOUS | Status: DC

## 2020-03-15 MED ORDER — ACETAMINOPHEN 325 MG PO TABS: 650.0000 mg | ORAL_TABLET | Freq: Four times a day (QID) | ORAL | Status: DC | PRN

## 2020-03-15 MED ORDER — SIMVASTATIN 20 MG PO TABS: 20.0000 mg | ORAL_TABLET | Freq: Every day | ORAL | Status: DC

## 2020-03-15 MED ORDER — ACETAMINOPHEN 650 MG RE SUPP: 650.0000 mg | Freq: Four times a day (QID) | RECTAL | Status: DC | PRN

## 2020-03-15 MED ORDER — ATENOLOL 25 MG PO TABS: 25.0000 mg | ORAL_TABLET | Freq: Every day | ORAL | Status: DC

## 2020-03-15 MED ORDER — DEXTROSE-NACL 5-0.45 % IV SOLN: INTRAVENOUS | Status: DC

## 2020-03-15 NOTE — ED Notes (Signed)
Pt sleeping on stretcher, appears comfortable. Respirations are equal and unlabored, no signs are acute distress.

## 2020-03-15 NOTE — Plan of Care (Signed)
Verbalized understanding of current plan of care 

## 2020-03-15 NOTE — ED Notes (Signed)
Heads up given, RN to call in two minutes per unit for report.

## 2020-03-15 NOTE — Progress Notes (Signed)
PROGRESS NOTE    Marissa Mullen  [id number] DOB: Jul 20, 1941 DOA: 03/14/2020 PCP: Maryland Pink, MD    Brief Narrative:  Marissa Mullen is a 79 y.o. female with medical history significant of Marissa Mullen a 79 y.o.femalewith hypertension, hyperlipidemia, GERD, HTN presents with rectal bleeding  , dry heaving, and abdominal pain.Her symptoms started today. Was having abdominal pain, went to the bathroom and found with red blood and clot of blood in commode. No vomiting but with dry heaving. +chills, shakes, no fever. No dysuria or other sx.  Pt reports is suppose to see Dr. Alice Mullen , GI for cscope on monday  ED Course: in ER pt found wbc 12.3, tachycardiac, ct abd/pel- Diffuse circumferential wall thickening the sigmoid colon and descending colon is concerning for infectious or inflammatory colitis. There is no evidence for active extravasation at this Level. Was started on cipro and flagyl.     Consultants:   GI  Procedures: CT  Antimicrobials:   Cipro metronidazole   Subjective: Feeling better today.  Less abdominal pain.  No bowel movement so far.  She felt IV fluids did help.  Objective: Vitals:   03/15/20 1239 03/15/20 1315 03/15/20 1400 03/15/20 1430  BP:   107/62 110/69  Pulse:  78    Resp: 16     Temp: 98.6 F (37 C)     TempSrc: Oral     SpO2:  94%    Weight:      Height:       No intake or output data in the 24 hours ending 03/15/20 1551 Filed Weights   03/14/20 1141  Weight: 77.1 kg    Examination:  General exam: Appears calm and comfortable  Respiratory system: Clear to auscultation. Respiratory effort normal. Cardiovascular system: S1 & S2 heard, RRR. No JVD, murmurs, rubs, gallops or clicks.  Gastrointestinal system: Abdomen is nondistended, soft and nontender. No organomegaly or masses felt. Normal bowel sounds heard. Central nervous system: Alert and oriented. No focal neurological deficits. Extremities: No edema Skin warm  dry Psychiatry: Judgement and insight appear normal. Mood & affect appropriate.     Data Reviewed: I have personally reviewed following labs and imaging studies  CBC: Recent Labs  Lab 03/14/20 1147 03/14/20 1823 03/15/20 0434  WBC 12.3*  --  12.7*  HGB 15.0 14.1 12.6  HCT 44.2 41.5 37.2  MCV 93.2  --  93.9  PLT 174  --  [id number]   Basic Metabolic Panel: Recent Labs  Lab 03/14/20 1147 03/15/20 0434  NA 138 139  K 4.5 3.6  CL 102 105  CO2 26 26  GLUCOSE 170* 135*  BUN 19 19  CREATININE 0.98 0.79  CALCIUM 10.2 9.1   GFR: Estimated Creatinine Clearance: 60.2 mL/min (by C-G formula based on SCr of 0.79 mg/dL). Liver Function Tests: Recent Labs  Lab 03/14/20 1147  AST 32  ALT 29  ALKPHOS 73  BILITOT 1.9*  PROT 7.7  ALBUMIN 4.2   Recent Labs  Lab 03/14/20 1147  LIPASE 25   No results for input(s): AMMONIA in the last 168 hours. Coagulation Profile: No results for input(s): INR, PROTIME in the last 168 hours. Cardiac Enzymes: No results for input(s): CKTOTAL, CKMB, CKMBINDEX, TROPONINI in the last 168 hours. BNP (last 3 results) No results for input(s): PROBNP in the last 8760 hours. HbA1C: No results for input(s): HGBA1C in the last 72 hours. CBG: No results for input(s): GLUCAP in the last 168 hours. Lipid Profile: No results  for input(s): CHOL, HDL, LDLCALC, TRIG, CHOLHDL, LDLDIRECT in the last 72 hours. Thyroid Function Tests: No results for input(s): TSH, T4TOTAL, FREET4, T3FREE, THYROIDAB in the last 72 hours. Anemia Panel: No results for input(s): VITAMINB12, FOLATE, FERRITIN, TIBC, IRON, RETICCTPCT in the last 72 hours. Sepsis Labs: Recent Labs  Lab 03/14/20 1823 03/15/20 0434  LATICACIDVEN 2.5* 1.8    Recent Results (from the past 240 hour(s))  SARS CORONAVIRUS 2 (TAT 6-24 HRS) Nasopharyngeal Nasopharyngeal Swab     Status: None   Collection Time: 03/14/20  6:23 PM   Specimen: Nasopharyngeal Swab  Result Value Ref Range Status   SARS  Coronavirus 2 NEGATIVE NEGATIVE Final    Comment: (NOTE) SARS-CoV-2 target nucleic acids are NOT DETECTED. The SARS-CoV-2 RNA is generally detectable in upper and lower respiratory specimens during the acute phase of infection. Negative results do not preclude SARS-CoV-2 infection, do not rule out co-infections with other pathogens, and should not be used as the sole basis for treatment or other patient management decisions. Negative results must be combined with clinical observations, patient history, and epidemiological information. The expected result is Negative. Fact Sheet for Patients: SugarRoll.be Fact Sheet for Healthcare Providers: https://www.woods-mathews.com/ This test is not yet approved or cleared by the [place] FDA and  has been authorized for detection and/or diagnosis of SARS-CoV-2 by FDA under an Emergency Use Authorization (EUA). This EUA will remain  in effect (meaning this test can be used) for the duration of the COVID-19 declaration under Section 56 4(b)(1) of the Act, 21 U.S.C. section 360bbb-3(b)(1), unless the authorization is terminated or revoked sooner. Performed at [facility], [place] [address], [place], [place] [address]   CULTURE, BLOOD (ROUTINE X 2) w Reflex to ID Panel     Status: None (Preliminary result)   Collection Time: 03/14/20  6:23 PM   Specimen: BLOOD  Result Value Ref Range Status   Specimen Description BLOOD BLOOD LEFT FOREARM  Final   Special Requests   Final    BOTTLES DRAWN AEROBIC AND ANAEROBIC Blood Culture adequate volume   Culture   Final    NO GROWTH < 12 HOURS Performed at [facility], [address], [place], [place] [address]    Report Status PENDING  Incomplete  CULTURE, BLOOD (ROUTINE X 2) w Reflex to ID Panel     Status: None (Preliminary result)   Collection Time: 03/14/20  6:24 PM   Specimen: BLOOD  Result Value Ref Range Status   Specimen Description  BLOOD LEFT ANTECUBITAL  Final   Special Requests   Final    BOTTLES DRAWN AEROBIC AND ANAEROBIC Blood Culture adequate volume   Culture   Final    NO GROWTH < 12 HOURS Performed at [facility], [address], [place],  [address]    Report Status PENDING  Incomplete         Radiology Studies: CT Angio Abd/Pel W and/or Wo Contrast  Result Date: 03/14/2020 CLINICAL DATA:  GI bleed EXAM: CTA ABDOMEN AND PELVIS WITHOUT AND WITH CONTRAST TECHNIQUE: Multidetector CT imaging of the abdomen and pelvis was performed using the standard protocol during bolus administration of intravenous contrast. Multiplanar reconstructed images and MIPs were obtained and reviewed to evaluate the vascular anatomy. CONTRAST:  136mL OMNIPAQUE IOHEXOL 350 MG/ML SOLN COMPARISON:  None. FINDINGS: VASCULAR Aorta: Normal caliber aorta without aneurysm, dissection, vasculitis or significant stenosis. Atherosclerotic changes are noted throughout the abdominal aorta. Celiac: Patent without evidence of aneurysm, dissection, vasculitis  or significant stenosis. SMA: Patent without evidence of aneurysm, dissection, vasculitis or significant stenosis. Renals: Both renal arteries are patent without evidence of aneurysm, dissection, vasculitis, fibromuscular dysplasia or significant stenosis. There are 2 renal arteries bilaterally. IMA: Patent without evidence of aneurysm, dissection, vasculitis or significant stenosis. Inflow: Patent without evidence of aneurysm, dissection, vasculitis or significant stenosis. Proximal Outflow: Bilateral common femoral and visualized portions of the superficial and profunda femoral arteries are patent without evidence of aneurysm, dissection, vasculitis or significant stenosis. Veins: No obvious venous abnormality within the limitations of this arterial phase study. Review of the MIP images confirms the above findings. NON-VASCULAR Lower chest: There is some branching mucous plugging  involving the peripheral right lower lobe. There is a calcified granuloma at the left lung base.The heart size is normal. Hepatobiliary: The liver is normal. Normal gallbladder.There is no biliary ductal dilation. Pancreas: Normal contours without ductal dilatation. No peripancreatic fluid collection. Spleen: There is scattered calcifications throughout the spleen consistent with a prior granulomatous infection. Adrenals/Urinary Tract: --Adrenal glands: No adrenal hemorrhage. --Right kidney/ureter: No hydronephrosis or perinephric hematoma. --Left kidney/ureter: No hydronephrosis or perinephric hematoma. --Urinary bladder: Unremarkable. Stomach/Bowel: --Stomach/Duodenum: There is a moderate-sized hiatal hernia. --Small bowel: No dilatation or inflammation. --Colon: There is severe sigmoid diverticulosis. There is diffuse circumferential wall thickening throughout much of the sigmoid colon and descending colon. There is no abscess or free air. There is no evidence for active arterial extravasation. --Appendix: Normal. Lymphatic: --No retroperitoneal lymphadenopathy. --No mesenteric lymphadenopathy. --No pelvic or inguinal lymphadenopathy. Reproductive: There is a cystic structure involving the right lateral aspect of the vaginal cuff measuring approximately 2.5 cm. The patient is status post prior hysterectomy. Other: No ascites or free air. The abdominal wall is normal. Musculoskeletal. No acute displaced fractures. IMPRESSION: 1. Diffuse circumferential wall thickening the sigmoid colon and descending colon is concerning for infectious or inflammatory colitis. There is no evidence for active extravasation at this level. 2. No other acute abnormality identified within the abdomen or pelvis. 3. Cystic appearing mass involving the right lateral aspect of the vaginal cuff. Correlation with a nonemergent outpatient pelvic ultrasound is recommended. 4. Moderate-sized hiatal hernia. 5.  Aortic Atherosclerosis  (ICD10-I70.0). Electronically Signed   By: Constance Holster M.D.   On: 03/14/2020 17:16        Scheduled Meds: . pantoprazole (PROTONIX) IV  40 mg Intravenous Q24H  . polyethylene glycol powder  1 Container Oral Once  . simvastatin  20 mg Oral Daily   Continuous Infusions: . ciprofloxacin Stopped (03/15/20 0751)  . dextrose 5 % and 0.45% NaCl 75 mL/hr at 03/15/20 0123  . metronidazole Stopped (03/15/20 1354)    Assessment & Plan:   Active Problems:   Colitis   1. Colitis- infectious v.s. inflammatory.  CT abn. See above Will place on cipro and metronidazole ivf Iv ppi Ck blood culture Stool culture GIs input was appreciated colonoscopy in a.m. Lactic acid improved  2.HTN-hold BP meds for now since with soft BP 3. DL - continue with statin   DVT prophylaxis: SCD Code Status: Full Family Communication: None at bedside Disposition Plan: Back home Barrier: Medically not ready as patient still not at baseline.  Plan for colonoscopy tomorrow morning with Dr. Alice Mullen       LOS: 1 day   Time spent: 45 minutes with more than 50% COC    Nolberto Hanlon, MD [facility] Pager [phone]  If 7PM-7AM, please contact night-coverage [email or web address] Password [facility] 03/15/2020, 3:51 PM

## 2020-03-15 NOTE — Consult Note (Signed)
GI Inpatient Consult Note  Reason for Consult: Rectal bleeding    Attending Requesting Consult: Dr. Nolberto Hanlon  History of Present Illness: Marissa Mullen is a 79 y.o. female seen for evaluation of hematochezia at the request of Dr. Kurtis Bushman. Pt has a PMH of HTN, HLD, GERD, and osteoarthritis. She is an established patient at [facility] and was scheduled for surveillance colonoscopy 2/2 personal hx of adenomatous colon polyps to be performed on 03/22 with Dr. Alice Reichert. She was scheduled to have her outpatient Covid test yesterday afternoon, but called in to our office yesterday afternoon with complaints of multiple episodes of painless rectal bleeding. She reports around 1230 am Wednesday night she noticed she felt very gassy and felt the urge to have a BM and passed a brown, liquid stool with no frank blood. She reports this BM was associated with some chills and mild nausea and dry heaves. She went back to bed and noticed around 1AM she had another urge to have a BM and just passed bright red blood per rectum. She reports 4-5 episodes over the next 12 hours with maroon colored stools with clots. Upon presentation to the ED, she was found to be hemodynamically stable with hgb 15.0. CTA abd/pelvis showed diffuse circumferential wall thickening of the sigmoid and descending colon, concerning for infectious or inflammatory colitis.  She had mild leukocytosis this morning with WBC 12.7, Hgb 12.6, blood cultures negative x2, and stool culture pending.  Patient reports that she has not passed any stool since last night, but has had several episodes of bright red blood per rectum without any associated pain.  She denies any abdominal pain or cramping.  There is no rectal pain.  Patient denies any previous history of diverticulitis.  She reports an episode in 2017 when she presented to the ED for painless bright red blood per rectum, but was told it was likely internal hemorrhoids versus diverticular.  She had  colonoscopy 2015 done by Dr. Candace Cruise which showed diverticulosis with no colon polyps.  She denies any fevers, chills, nausea, vomiting, abdominal pain, or diarrhea.  Patient's daughter Marissa Mullen on the telephone during my examination in the room and was very active in helping obtain the history.    Last Colonoscopy: 2015 - diverticulosis  Last Endoscopy: N/A   Past Medical History:  Past Medical History:  Diagnosis Date  . Arthritis   . GERD (gastroesophageal reflux disease)   . Hepatitis    "Age 8"  . Hypercholesterolemia   . Hypertension     Problem List: Patient Active Problem List   Diagnosis Date Noted  . Colitis 03/14/2020  . 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 ABDOMINAL HYSTERECTOMY    . PARTIAL KNEE ARTHROPLASTY Left 01/23/2016   Procedure: UNICOMPARTMENTAL KNEE;  Surgeon: Corky Mull, MD;  Location: [facility] ORS;  Service: Orthopedics;  Laterality: Left;    Allergies: Allergies  Allergen Reactions  . Ondansetron     Other reaction(s): Abdominal Pain  . Oxycodone Itching  . Tramadol Itching    Unknown     Home Medications: (Not in a hospital admission)  Home medication reconciliation was completed with the patient.   Scheduled Inpatient Medications:   . pantoprazole (PROTONIX) IV  40 mg Intravenous Q24H  . simvastatin  20 mg Oral Daily    Continuous Inpatient Infusions:   . ciprofloxacin Stopped (03/15/20 0751)  . dextrose 5 % and  0.45% NaCl 75 mL/hr at 03/15/20 0123  . metronidazole 500 mg (03/15/20 1234)    PRN Inpatient Medications:  acetaminophen **OR** acetaminophen  Family History: family history includes Breast cancer (age of onset: 53) in her daughter; Diabetes in her mother.  The patient's family history is negative for inflammatory bowel disorders, GI malignancy, or solid organ transplantation.  Social History:   reports that she has quit  smoking. Her smoking use included cigarettes. She smoked 0.50 packs per day. She has never used smokeless tobacco. She reports that she does not drink alcohol or use drugs. The patient denies ETOH, tobacco, or drug use.   Review of Systems: Constitutional: Weight is stable.  Eyes: No changes in vision. ENT: No oral lesions, sore throat.  GI: see HPI.  Heme/Lymph: No easy bruising.  CV: No chest pain.  GU: No hematuria.  Integumentary: No rashes.  Neuro: No headaches.  Psych: No depression/anxiety.  Endocrine: No heat/cold intolerance.  Allergic/Immunologic: No urticaria.  Resp: No cough, SOB.  Musculoskeletal: No joint swelling.    Physical Examination: BP 104/64   Pulse 82   Temp 98.6 F (37 C) (Oral)   Resp 16   Ht 5\' 4"  (1.626 m)   Wt 77.1 kg   SpO2 97%   BMI 29.18 kg/m  Gen: NAD, alert and oriented x 4 HEENT: PEERLA, EOMI, Neck: supple, no JVD or thyromegaly Chest: CTA bilaterally, no wheezes, crackles, or other adventitious sounds CV: RRR, no m/g/c/r Abd: soft, NT, ND, +BS in all four quadrants; no HSM, guarding, ridigity, or rebound tenderness Ext: no edema, well perfused with 2+ pulses, Skin: no rash or lesions noted Lymph: no LAD  Data: Lab Results  Component Value Date   WBC 12.7 (H) 03/15/2020   HGB 12.6 03/15/2020   HCT 37.2 03/15/2020   MCV 93.9 03/15/2020   PLT 140 (L) 03/15/2020   Recent Labs  Lab 03/14/20 1147 03/14/20 1823 03/15/20 0434  HGB 15.0 14.1 12.6   Lab Results  Component Value Date   NA 139 03/15/2020   K 3.6 03/15/2020   CL 105 03/15/2020   CO2 26 03/15/2020   BUN 19 03/15/2020   CREATININE 0.79 03/15/2020   Lab Results  Component Value Date   ALT 29 03/14/2020   AST 32 03/14/2020   ALKPHOS 73 03/14/2020   BILITOT 1.9 (H) 03/14/2020   No results for input(s): APTT, INR, PTT in the last 168 hours.  CT abd/pelvis with contrast 03/14/2020: IMPRESSION: 1. Diffuse circumferential wall thickening the sigmoid colon  and descending colon is concerning for infectious or inflammatory colitis. There is no evidence for active extravasation at this level. 2. No other acute abnormality identified within the abdomen or pelvis. 3. Cystic appearing mass involving the right lateral aspect of the vaginal cuff. Correlation with a nonemergent outpatient pelvic ultrasound is recommended. 4. Moderate-sized hiatal hernia. 5.  Aortic Atherosclerosis (ICD10-I70.0).   Assessment/Plan:  79 y/o Caucasian female with a PMH of HTN, HLD, GERD, and OA admitted for painless hematochezia  1. Painless rectal bleeding  2. Left-sided colitis - diffuse circumferential wall thickening in sigmoid and descending colon.  Low suspicion for infectious colitis given she is not having any abdominal pain or diarrhea.  -Symptoms consistent with diverticular bleeding versus anal outlet etiology. DDx also includes colon polyp, IBD, ischemic colitis, malignancy -Continue to monitor H&H.  Transfuse to ensure hemoglobin greater than 7 -Agree with Cipro and Flagyl. -Agree with acid suppression -Advised luminal evaluation with colonoscopy for definitive  evaluation.  We discussed procedure details indications in ED room today.  Daughter was active during history and physical exam and agrees with above plan of care. -Plan for colonoscopy tomorrow morning with Dr. Alice Reichert -Further recommendation after procedure.  See procedure note for findings and further recommendations.  I reviewed the risks (including bleeding, perforation, infection, anesthesia complications, cardiac/respiratory complications), benefits and alternatives of colonsocopy. Patient consents to proceed.    Thank you for the consult. Please call with questions or concerns.  Reeves Forth [facility] [phone] (Cell)

## 2020-03-15 NOTE — H&P (View-Only) (Signed)
GI Inpatient Consult Note  Reason for Consult: Rectal bleeding    Attending Requesting Consult: Dr. Nolberto Hanlon  History of Present Illness: Marissa Mullen is a 79 y.o. female seen for evaluation of hematochezia at the request of Dr. Kurtis Bushman. Pt has a PMH of HTN, HLD, GERD, and osteoarthritis. She is an established patient at [facility] and was scheduled for surveillance colonoscopy 2/2 personal hx of adenomatous colon polyps to be performed on 03/22 with Dr. Alice Reichert. She was scheduled to have her outpatient Covid test yesterday afternoon, but called in to our office yesterday afternoon with complaints of multiple episodes of painless rectal bleeding. She reports around 1230 am Wednesday night she noticed she felt very gassy and felt the urge to have a BM and passed a brown, liquid stool with no frank blood. She reports this BM was associated with some chills and mild nausea and dry heaves. She went back to bed and noticed around 1AM she had another urge to have a BM and just passed bright red blood per rectum. She reports 4-5 episodes over the next 12 hours with maroon colored stools with clots. Upon presentation to the ED, she was found to be hemodynamically stable with hgb 15.0. CTA abd/pelvis showed diffuse circumferential wall thickening of the sigmoid and descending colon, concerning for infectious or inflammatory colitis.  She had mild leukocytosis this morning with WBC 12.7, Hgb 12.6, blood cultures negative x2, and stool culture pending.  Patient reports that she has not passed any stool since last night, but has had several episodes of bright red blood per rectum without any associated pain.  She denies any abdominal pain or cramping.  There is no rectal pain.  Patient denies any previous history of diverticulitis.  She reports an episode in 2017 when she presented to the ED for painless bright red blood per rectum, but was told it was likely internal hemorrhoids versus diverticular.  She had  colonoscopy 2015 done by Dr. Candace Cruise which showed diverticulosis with no colon polyps.  She denies any fevers, chills, nausea, vomiting, abdominal pain, or diarrhea.  Patient's daughter Marissa Mullen on the telephone during my examination in the room and was very active in helping obtain the history.    Last Colonoscopy: 2015 - diverticulosis  Last Endoscopy: N/A   Past Medical History:  Past Medical History:  Diagnosis Date  . Arthritis   . GERD (gastroesophageal reflux disease)   . Hepatitis    "Age 60"  . Hypercholesterolemia   . Hypertension     Problem List: Patient Active Problem List   Diagnosis Date Noted  . Colitis 03/14/2020  . 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 ABDOMINAL HYSTERECTOMY    . PARTIAL KNEE ARTHROPLASTY Left 01/23/2016   Procedure: UNICOMPARTMENTAL KNEE;  Surgeon: Corky Mull, MD;  Location: [facility] ORS;  Service: Orthopedics;  Laterality: Left;    Allergies: Allergies  Allergen Reactions  . Ondansetron     Other reaction(s): Abdominal Pain  . Oxycodone Itching  . Tramadol Itching    Unknown     Home Medications: (Not in a hospital admission)  Home medication reconciliation was completed with the patient.   Scheduled Inpatient Medications:   . pantoprazole (PROTONIX) IV  40 mg Intravenous Q24H  . simvastatin  20 mg Oral Daily    Continuous Inpatient Infusions:   . ciprofloxacin Stopped (03/15/20 0751)  . dextrose 5 % and  0.45% NaCl 75 mL/hr at 03/15/20 0123  . metronidazole 500 mg (03/15/20 1234)    PRN Inpatient Medications:  acetaminophen **OR** acetaminophen  Family History: family history includes Breast cancer (age of onset: 36) in her daughter; Diabetes in her mother.  The patient's family history is negative for inflammatory bowel disorders, GI malignancy, or solid organ transplantation.  Social History:   reports that she has quit  smoking. Her smoking use included cigarettes. She smoked 0.50 packs per day. She has never used smokeless tobacco. She reports that she does not drink alcohol or use drugs. The patient denies ETOH, tobacco, or drug use.   Review of Systems: Constitutional: Weight is stable.  Eyes: No changes in vision. ENT: No oral lesions, sore throat.  GI: see HPI.  Heme/Lymph: No easy bruising.  CV: No chest pain.  GU: No hematuria.  Integumentary: No rashes.  Neuro: No headaches.  Psych: No depression/anxiety.  Endocrine: No heat/cold intolerance.  Allergic/Immunologic: No urticaria.  Resp: No cough, SOB.  Musculoskeletal: No joint swelling.    Physical Examination: BP 104/64   Pulse 82   Temp 98.6 F (37 C) (Oral)   Resp 16   Ht 5\' 4"  (1.626 m)   Wt 77.1 kg   SpO2 97%   BMI 29.18 kg/m  Gen: NAD, alert and oriented x 4 HEENT: PEERLA, EOMI, Neck: supple, no JVD or thyromegaly Chest: CTA bilaterally, no wheezes, crackles, or other adventitious sounds CV: RRR, no m/g/c/r Abd: soft, NT, ND, +BS in all four quadrants; no HSM, guarding, ridigity, or rebound tenderness Ext: no edema, well perfused with 2+ pulses, Skin: no rash or lesions noted Lymph: no LAD  Data: Lab Results  Component Value Date   WBC 12.7 (H) 03/15/2020   HGB 12.6 03/15/2020   HCT 37.2 03/15/2020   MCV 93.9 03/15/2020   PLT 140 (L) 03/15/2020   Recent Labs  Lab 03/14/20 1147 03/14/20 1823 03/15/20 0434  HGB 15.0 14.1 12.6   Lab Results  Component Value Date   NA 139 03/15/2020   K 3.6 03/15/2020   CL 105 03/15/2020   CO2 26 03/15/2020   BUN 19 03/15/2020   CREATININE 0.79 03/15/2020   Lab Results  Component Value Date   ALT 29 03/14/2020   AST 32 03/14/2020   ALKPHOS 73 03/14/2020   BILITOT 1.9 (H) 03/14/2020   No results for input(s): APTT, INR, PTT in the last 168 hours.  CT abd/pelvis with contrast 03/14/2020: IMPRESSION: 1. Diffuse circumferential wall thickening the sigmoid colon  and descending colon is concerning for infectious or inflammatory colitis. There is no evidence for active extravasation at this level. 2. No other acute abnormality identified within the abdomen or pelvis. 3. Cystic appearing mass involving the right lateral aspect of the vaginal cuff. Correlation with a nonemergent outpatient pelvic ultrasound is recommended. 4. Moderate-sized hiatal hernia. 5.  Aortic Atherosclerosis (ICD10-I70.0).   Assessment/Plan:  79 y/o Caucasian female with a PMH of HTN, HLD, GERD, and OA admitted for painless hematochezia  1. Painless rectal bleeding  2. Left-sided colitis - diffuse circumferential wall thickening in sigmoid and descending colon.  Low suspicion for infectious colitis given she is not having any abdominal pain or diarrhea.  -Symptoms consistent with diverticular bleeding versus anal outlet etiology. DDx also includes colon polyp, IBD, ischemic colitis, malignancy -Continue to monitor H&H.  Transfuse to ensure hemoglobin greater than 7 -Agree with Cipro and Flagyl. -Agree with acid suppression -Advised luminal evaluation with colonoscopy for definitive  evaluation.  We discussed procedure details indications in ED room today.  Daughter was active during history and physical exam and agrees with above plan of care. -Plan for colonoscopy tomorrow morning with Dr. Alice Reichert -Further recommendation after procedure.  See procedure note for findings and further recommendations.  I reviewed the risks (including bleeding, perforation, infection, anesthesia complications, cardiac/respiratory complications), benefits and alternatives of colonsocopy. Patient consents to proceed.    Thank you for the consult. Please call with questions or concerns.  Reeves Forth [facility] [phone] (Cell)

## 2020-03-15 NOTE — ED Notes (Signed)
Admitting MD with pt at this time.

## 2020-03-15 NOTE — ED Notes (Signed)
Report given to Almyra Free, [place]. No transportation available at this time, will take pt up.

## 2020-03-15 NOTE — ED Notes (Signed)
Spoke with pt's daughter Weston Anna who reports pt + COVID in December and has had 1st & 2nd [place] vaccines in February

## 2020-03-15 NOTE — ED Notes (Signed)
Pt transported to room, Unit notified of pt arrival.

## 2020-03-16 ENCOUNTER — Encounter
Admission: EM | Disposition: A | Discharge status: Home / Self Care | Payer: Self-pay | Source: Home / Self Care | Attending: Internal Medicine

## 2020-03-16 ENCOUNTER — Inpatient Hospital Stay: Payer: Medicare Other | Admitting: Anesthesiology

## 2020-03-16 ENCOUNTER — Encounter: Payer: Self-pay | Admitting: Internal Medicine

## 2020-03-16 DIAGNOSIS — E860 Dehydration: Secondary | ICD-10-CM

## 2020-03-16 HISTORY — PX: COLONOSCOPY WITH PROPOFOL: SHX5780

## 2020-03-16 LAB — CBC
HCT: 36 % (ref 36.0–46.0)
Hemoglobin: 12 g/dL (ref 12.0–15.0)
MCH: 31.6 pg (ref 26.0–34.0)
MCHC: 33.3 g/dL (ref 30.0–36.0)
MCV: 94.7 fL (ref 80.0–100.0)
Platelets: 117 10*3/uL — ABNORMAL LOW (ref 150–400)
RBC: 3.8 MIL/uL — ABNORMAL LOW (ref 3.87–5.11)
RDW: 13.2 % (ref 11.5–15.5)
WBC: 7 10*3/uL (ref 4.0–10.5)
nRBC: 0 % (ref 0.0–0.2)

## 2020-03-16 SURGERY — COLONOSCOPY WITH PROPOFOL
Anesthesia: General

## 2020-03-16 MED ORDER — LIDOCAINE HCL (CARDIAC) PF 100 MG/5ML IV SOSY
PREFILLED_SYRINGE | INTRAVENOUS | Status: DC | PRN
  Administered 2020-03-16: 40 mg via INTRAVENOUS

## 2020-03-16 MED ORDER — FENTANYL CITRATE (PF) 100 MCG/2ML IJ SOLN: 25.0000 ug | INTRAMUSCULAR | Status: DC | PRN

## 2020-03-16 MED ORDER — PROPOFOL 10 MG/ML IV BOLUS
INTRAVENOUS | Status: DC | PRN
  Administered 2020-03-16: 40 mg via INTRAVENOUS
  Administered 2020-03-16 (×2): 30 mg via INTRAVENOUS
  Administered 2020-03-16: 80 mg via INTRAVENOUS

## 2020-03-16 MED ORDER — ASPIRIN EC 81 MG PO TBEC: 81.0000 mg | DELAYED_RELEASE_TABLET | Freq: Every day | ORAL | Status: AC

## 2020-03-16 NOTE — Op Note (Signed)
[facility] Patient Name: Marissa Mullen Procedure Date: 03/16/2020 1:12 PM MRN: [id number] Account #: [id number] Date of Birth: [birth year] Admit Type: Inpatient Age: 79 Room: [facility] ENDO ROOM 4 Gender: Female Note Status: Finalized Procedure:             Colonoscopy Indications:           Hematochezia, Abnormal CT of the GI tract Providers:             Benay Pike. Alice Reichert MD, MD Referring MD:          Irven Easterly. Kary Kos, MD (Referring MD) Medicines:             Propofol per Anesthesia Complications:         Decreased oxygen saturation with hypoventilation,                         quickly recovered with bag mask ventilation and                         quickly terminating the procedure. Withdrawal time was                         cut short (1-2 min) due to patient hypoventilation. Procedure:             Pre-Anesthesia Assessment:                        - The risks and benefits of the procedure and the                         sedation options and risks were discussed with the                         patient. All questions were answered and informed                         consent was obtained.                        - Patient identification and proposed procedure were                         verified prior to the procedure by the nurse. The                         procedure was verified in the procedure room.                        - ASA Grade Assessment: III - A patient with severe                         systemic disease.                        - After reviewing the risks and benefits, the patient                         was deemed in satisfactory condition to undergo the  procedure.                        After obtaining informed consent, the colonoscope was                         passed under direct vision. Throughout the procedure,                         the patient's blood pressure, pulse, and oxygen    saturations were monitored continuously. The                         Colonoscope was introduced through the anus and                         advanced to the the cecum, identified by appendiceal                         orifice and ileocecal valve. The colonoscopy was                         somewhat difficult due to multiple diverticula in the                         colon. Successful completion of the procedure was                         aided by withdrawing and reinserting the scope. The                         patient tolerated the procedure well. The quality of                         the bowel preparation was good. The entire colon was                         examined. Findings:      The perianal and digital rectal examinations were normal. Pertinent       negatives include normal sphincter tone and no palpable rectal lesions.      Non-bleeding internal hemorrhoids were found during retroflexion. The       hemorrhoids were Grade I (internal hemorrhoids that do not prolapse).      Multiple small and large-mouthed diverticula were found in the sigmoid       colon. There was no evidence of diverticular bleeding.      Segmental moderate inflammation characterized by congestion (edema),       erythema and friability was found in the mid descending colon. Biopsies       were taken with a cold forceps for histology.      A 8 mm polyp was found in the cecum. The polyp was sessile. Polypectomy       was not attempted due to the patient's condition which required stopping       the procedure.      The exam was otherwise without abnormality. Impression:            - Non-bleeding internal hemorrhoids.                        -  Severe diverticulosis in the sigmoid colon. There                         was no evidence of diverticular bleeding.                        - Segmental moderate inflammation was found in the mid                         descending colon secondary to ischemic colitis.                          Biopsied.                        - One 8 mm polyp in the cecum. Resection not attempted.                        - The examination was otherwise normal. Recommendation:        - Patient has a contact number available for                         emergencies. The signs and symptoms of potential                         delayed complications were discussed with the patient.                         Return to normal activities tomorrow. Written                         discharge instructions were provided to the patient.                        - Return patient to hospital ward for possible                         discharge same day.                        - Advance diet as tolerated.                        - Continue present medications.                        - Await pathology results.                        - Return to my office in 2 weeks. Procedure Code(s):     --- Professional ---                        [phone], Colonoscopy, flexible; with biopsy, single or                         multiple Diagnosis Code(s):     --- Professional ---                        R93.3, Abnormal findings on diagnostic imaging  of                         other parts of digestive tract                        K57.30, Diverticulosis of large intestine without                         perforation or abscess without bleeding                        K92.1, Melena (includes Hematochezia)                        K63.5, Polyp of colon                        K55.9, Vascular disorder of intestine, unspecified                        K64.0, First degree hemorrhoids CPT copyright 2019 American Medical Association. All rights reserved. The codes documented in this report are preliminary and upon coder review may  be revised to meet current compliance requirements. Efrain Sella MD, MD 03/16/2020 1:46:25 PM This report has been signed electronically. Number of Addenda: 0 Note Initiated On: 03/16/2020 1:12 PM Scope  Withdrawal Time: 0 hours 3 minutes 41 seconds  Total Procedure Duration: 0 hours 12 minutes 1 second  Estimated Blood Loss:  Estimated blood loss: none. Estimated blood loss was                         minimal. Estimated blood loss was minimal.      [facility]

## 2020-03-16 NOTE — Interval H&P Note (Signed)
History and Physical Interval Note:  03/16/2020 12:30 PM  Marissa Mullen  has presented today for surgery, with the diagnosis of Left-sided colitis, hematochezia.  The various methods of treatment have been discussed with the patient and family. After consideration of risks, benefits and other options for treatment, the patient has consented to  Procedure(s): COLONOSCOPY WITH PROPOFOL (N/A) as a surgical intervention.  The patient's history has been reviewed, patient examined, no change in status, stable for surgery.  I have reviewed the patient's chart and labs.  Questions were answered to the patient's satisfaction.     [place], [place]

## 2020-03-16 NOTE — Plan of Care (Signed)

## 2020-03-16 NOTE — Transfer of Care (Signed)
Immediate Anesthesia Transfer of Care Note  Patient: Marissa Mullen  Procedure(s) Performed: COLONOSCOPY WITH PROPOFOL (N/A )  Patient Location: PACU  Anesthesia Type:General  Level of Consciousness: awake and alert   Airway & Oxygen Therapy: Patient Spontanous Breathing and Patient connected to nasal cannula oxygen  Post-op Assessment: Report given to RN and Post -op Vital signs reviewed and stable  Post vital signs: Reviewed and stable  Last Vitals:  Vitals Value Taken Time  BP    Temp    Pulse    Resp    SpO2      Last Pain:  Vitals:   03/16/20 1240  TempSrc: Temporal  PainSc: 0-No pain         Complications: No apparent anesthesia complications

## 2020-03-16 NOTE — Progress Notes (Signed)
D: Pt alert and oriented x 4. Pt denies experiencing any pain at this time.   A: Pt and daughter received discharge and medication education/information. Pt belongings were gathered and taken with pt upon discharge.   R: Pt and daughter verbalized understanding of discharge and medication education/information.  Pt escorted to medical mall front lobby by staff via wheelchair where daughter picked her up.

## 2020-03-16 NOTE — Interval H&P Note (Signed)
History and Physical Interval Note:  03/16/2020 12:43 PM  Marissa Mullen  has presented today for surgery, with the diagnosis of Left-sided colitis, hematochezia.  The various methods of treatment have been discussed with the patient and family. After consideration of risks, benefits and other options for treatment, the patient has consented to  Procedure(s): COLONOSCOPY WITH PROPOFOL (N/A) as a surgical intervention.  The patient's history has been reviewed, patient examined, no change in status, stable for surgery.  I have reviewed the patient's chart and labs.  Questions were answered to the patient's satisfaction.     [place], [place]

## 2020-03-16 NOTE — Anesthesia Preprocedure Evaluation (Signed)
Anesthesia Evaluation  Patient identified by MRN, date of birth, ID band Patient awake    Reviewed: Allergy & Precautions, H&P , NPO status , Patient's Chart, lab work & pertinent test results, reviewed documented beta blocker date and time   Airway Mallampati: II   Neck ROM: full    Dental  (+) Poor Dentition   Pulmonary neg pulmonary ROS, former smoker,    Pulmonary exam normal        Cardiovascular hypertension, Pt. on medications and Pt. on home beta blockers negative cardio ROS Normal cardiovascular exam     Neuro/Psych negative neurological ROS  negative psych ROS   GI/Hepatic negative GI ROS, Neg liver ROS, GERD  Medicated and Controlled,(+) Hepatitis -  Endo/Other  negative endocrine ROS  Renal/GU negative Renal ROS  negative genitourinary   Musculoskeletal   Abdominal   Peds negative pediatric ROS (+)  Hematology negative hematology ROS (+)   Anesthesia Other Findings Past Medical History:   Hypertension                                                 GERD (gastroesophageal reflux disease)                       Arthritis                                                    Hepatitis                                                      Comment:"Age 79"   Hypercholesterolemia                                       Past Surgical History:   ABDOMINAL HYSTERECTOMY                                      BMI    Body Mass Index   29.16 kg/m 2     Reproductive/Obstetrics                             Anesthesia Physical  Anesthesia Plan  ASA: III and emergent  Anesthesia Plan: General   Post-op Pain Management:    Induction:   PONV Risk Score and Plan: Propofol infusion  Airway Management Planned:   Additional Equipment:   Intra-op Plan:   Post-operative Plan:   Informed Consent: I have reviewed the patients History and Physical, chart, labs and discussed the procedure  including the risks, benefits and alternatives for the proposed anesthesia with the patient or authorized representative who has indicated his/her understanding and acceptance.     Dental Advisory Given  Plan Discussed with: CRNA  Anesthesia Plan Comments:         Anesthesia Quick Evaluation

## 2020-03-16 NOTE — Interval H&P Note (Signed)
History and Physical Interval Note:  03/16/2020 12:42 PM  Marissa Mullen  has presented today for surgery, with the diagnosis of Left-sided colitis, hematochezia.  The various methods of treatment have been discussed with the patient and family. After consideration of risks, benefits and other options for treatment, the patient has consented to  Procedure(s): COLONOSCOPY WITH PROPOFOL (N/A) as a surgical intervention.  The patient's history has been reviewed, patient examined, no change in status, stable for surgery.  I have reviewed the patient's chart and labs.  Questions were answered to the patient's satisfaction.     [place], [place]

## 2020-03-16 NOTE — Discharge Summary (Signed)
Marissa Mullen [id number] DOB: 1941-07-04 DOA: 03/14/2020  PCP: Marissa Pink, MD  Admit date: 03/14/2020 Discharge date: 03/16/2020  Admitted From: home Disposition:  home  Recommendations for Outpatient Follow-up:  1. Follow up with PCP in 1 week 2. Please obtain BMP/CBC in one week 3. Follow up with Dr. Alice Mullen in 2 weeks 4. F/u blood cultures  Home Health:none    Discharge Condition:Stable CODE STATUS:full  Diet recommendation: Heart Healthy  Brief/Interim Summary: Marissa Mullen a 79 y.o.femalewith medical history significant ofDorothy ENGA Mullen a 79 y.o.femalewith hypertension, hyperlipidemia, GERD, HTN presents with rectal bleeding , dry heaving, and abdominal pain.Her symptoms started today. Was having abdominal pain, went to the bathroom and found with red blood and clot of blood in commode. No vomiting but with dry heaving. +chills, shakes, no fever. No dysuria or other sx.  She was found to be dehydrated on exam. She was started on IVF. Gi was consulted.  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  I spoke to Dr. Alice Mullen he felt this was due to patient being dehydrated and low blood pressure.  During her hospitalization her blood pressure medications were held due to low blood pressure.  GI did not feel this was infectious and her metronidazole and ciprofloxacin which were started on admission for possible infectious colitis were discontinued.  GI is okay to be discharged from GI standpoint.  Patient has been feeling well since admission when she got IV fluids.  No dizziness, diarrhea, lightheadedness or any other complaints.  She is stable for discharge.  Colonoscopy:  Non-bleeding internal hemorrhoids.  Severe diverticulosis in the sigmoid colon. There was no evidence of diverticular bleeding. Segmental moderate inflammation was found in the mid   descending colon secondary to ischemic colitis. Biopsied. One 8 mm polyp in the cecum         Discharge Diagnoses:  Active Problems:   Colitis    Discharge Instructions  Discharge Instructions    Diet - low sodium heart healthy   Complete by: As directed    Discharge instructions   Complete by: As directed    Keep hydrated, drink lots of water Follow up with pcp in one week for blood work and discuss blood pressure meds. Held here because of dehydration. F/u with Dr. Alice Mullen in 2 weeks   *   Increase activity slowly   Complete by: As directed      Allergies as of 03/16/2020      Reactions   Ondansetron    Other reaction(s): Abdominal Pain   Oxycodone Itching   Tramadol Itching   Unknown      Medication List    STOP taking these medications   amLODipine 10 MG tablet Commonly known as: NORVASC   aspirin 325 MG tablet Replaced by: aspirin EC 81 MG tablet   atenolol 25 MG tablet Commonly known as: TENORMIN   benazepril 40 MG tablet Commonly known as: LOTENSIN     TAKE these medications   aspirin EC 81 MG tablet Take 1 tablet (81 mg total) by mouth daily. Start taking on: March 20, 2020 Replaces: aspirin 325 MG tablet   Calcium 600+D 600-800 MG-UNIT Tabs Generic drug: Calcium Carb-Cholecalciferol Take 2 tablets by mouth daily.   pantoprazole 40 MG tablet Commonly known as: PROTONIX Take 40 mg by mouth daily.   simvastatin 20 MG tablet Commonly known as: ZOCOR Take 20 mg by mouth daily.   Tylenol 8 Hour 650 MG CR tablet Generic drug: acetaminophen  Take 650-1,300 mg by mouth every 8 (eight) hours as needed for pain.   Womens Multivitamin Plus Tabs Take 1 tablet by mouth daily.       Allergies  Allergen Reactions  . Ondansetron     Other reaction(s): Abdominal Pain  . Oxycodone Itching  . Tramadol Itching    Unknown     Consultations:  GI   Procedures/Studies: CT Angio Abd/Pel W and/or Wo Contrast  Result Date: 03/14/2020 CLINICAL DATA:  GI bleed EXAM: CTA ABDOMEN AND PELVIS WITHOUT AND WITH CONTRAST TECHNIQUE:  Multidetector CT imaging of the abdomen and pelvis was performed using the standard protocol during bolus administration of intravenous contrast. Multiplanar reconstructed images and MIPs were obtained and reviewed to evaluate the vascular anatomy. CONTRAST:  147mL OMNIPAQUE IOHEXOL 350 MG/ML SOLN COMPARISON:  None. FINDINGS: VASCULAR Aorta: Normal caliber aorta without aneurysm, dissection, vasculitis or significant stenosis. Atherosclerotic changes are noted throughout the abdominal aorta. Celiac: Patent without evidence of aneurysm, dissection, vasculitis or significant stenosis. SMA: Patent without evidence of aneurysm, dissection, vasculitis or significant stenosis. Renals: Both renal arteries are patent without evidence of aneurysm, dissection, vasculitis, fibromuscular dysplasia or significant stenosis. There are 2 renal arteries bilaterally. IMA: Patent without evidence of aneurysm, dissection, vasculitis or significant stenosis. Inflow: Patent without evidence of aneurysm, dissection, vasculitis or significant stenosis. Proximal Outflow: Bilateral common femoral and visualized portions of the superficial and profunda femoral arteries are patent without evidence of aneurysm, dissection, vasculitis or significant stenosis. Veins: No obvious venous abnormality within the limitations of this arterial phase study. Review of the MIP images confirms the above findings. NON-VASCULAR Lower chest: There is some branching mucous plugging involving the peripheral right lower lobe. There is a calcified granuloma at the left lung base.The heart size is normal. Hepatobiliary: The liver is normal. Normal gallbladder.There is no biliary ductal dilation. Pancreas: Normal contours without ductal dilatation. No peripancreatic fluid collection. Spleen: There is scattered calcifications throughout the spleen consistent with a prior granulomatous infection. Adrenals/Urinary Tract: --Adrenal glands: No adrenal hemorrhage. --Right  kidney/ureter: No hydronephrosis or perinephric hematoma. --Left kidney/ureter: No hydronephrosis or perinephric hematoma. --Urinary bladder: Unremarkable. Stomach/Bowel: --Stomach/Duodenum: There is a moderate-sized hiatal hernia. --Small bowel: No dilatation or inflammation. --Colon: There is severe sigmoid diverticulosis. There is diffuse circumferential wall thickening throughout much of the sigmoid colon and descending colon. There is no abscess or free air. There is no evidence for active arterial extravasation. --Appendix: Normal. Lymphatic: --No retroperitoneal lymphadenopathy. --No mesenteric lymphadenopathy. --No pelvic or inguinal lymphadenopathy. Reproductive: There is a cystic structure involving the right lateral aspect of the vaginal cuff measuring approximately 2.5 cm. The patient is status post prior hysterectomy. Other: No ascites or free air. The abdominal wall is normal. Musculoskeletal. No acute displaced fractures. IMPRESSION: 1. Diffuse circumferential wall thickening the sigmoid colon and descending colon is concerning for infectious or inflammatory colitis. There is no evidence for active extravasation at this level. 2. No other acute abnormality identified within the abdomen or pelvis. 3. Cystic appearing mass involving the right lateral aspect of the vaginal cuff. Correlation with a nonemergent outpatient pelvic ultrasound is recommended. 4. Moderate-sized hiatal hernia. 5.  Aortic Atherosclerosis (ICD10-I70.0). Electronically Signed   By: Constance Holster M.D.   On: 03/14/2020 17:16       Subjective: No abd pain, fever, chills, feels better after ivf.   Discharge Exam: Vitals:   03/16/20 1417 03/16/20 1421  BP: 126/69   Pulse: 82 86  Resp: (!) 23 14  Temp: 99.1  F (37.3 C)   SpO2: 98% 98%   Vitals:   03/16/20 1357 03/16/20 1407 03/16/20 1417 03/16/20 1421  BP: 123/80 135/74 126/69   Pulse: 98 78 82 86  Resp: 20 (!) 22 (!) 23 14  Temp: 99.2 F (37.3 C)  99.1 F  (37.3 C)   TempSrc:      SpO2: 95% 100% 98% 98%  Weight:      Height:        General: Pt is alert, awake, not in acute distress Cardiovascular: RRR, S1/S2 +, no rubs, no gallops Respiratory: CTA bilaterally, no wheezing, no rhonchi Abdominal: Soft, NT, ND, bowel sounds + Extremities: no edema, no cyanosis    The results of significant diagnostics from this hospitalization (including imaging, microbiology, ancillary and laboratory) are listed below for reference.     Microbiology: Recent Results (from the past 240 hour(s))  SARS CORONAVIRUS 2 (TAT 6-24 HRS) Nasopharyngeal Nasopharyngeal Swab     Status: None   Collection Time: 03/14/20  6:23 PM   Specimen: Nasopharyngeal Swab  Result Value Ref Range Status   SARS Coronavirus 2 NEGATIVE NEGATIVE Final    Comment: (NOTE) SARS-CoV-2 target nucleic acids are NOT DETECTED. The SARS-CoV-2 RNA is generally detectable in upper and lower respiratory specimens during the acute phase of infection. Negative results do not preclude SARS-CoV-2 infection, do not rule out co-infections with other pathogens, and should not be used as the sole basis for treatment or other patient management decisions. Negative results must be combined with clinical observations, patient history, and epidemiological information. The expected result is Negative. Fact Sheet for Patients: SugarRoll.be Fact Sheet for Healthcare Providers: https://www.woods-mathews.com/ This test is not yet approved or cleared by the [place] FDA and  has been authorized for detection and/or diagnosis of SARS-CoV-2 by FDA under an Emergency Use Authorization (EUA). This EUA will remain  in effect (meaning this test can be used) for the duration of the COVID-19 declaration under Section 56 4(b)(1) of the Act, 21 U.S.C. section 360bbb-3(b)(1), unless the authorization is terminated or revoked sooner. Performed at [facility], [place] [address], [place], [place] [address]   CULTURE, BLOOD (ROUTINE X 2) w Reflex to ID Panel     Status: None (Preliminary result)   Collection Time: 03/14/20  6:23 PM   Specimen: BLOOD  Result Value Ref Range Status   Specimen Description BLOOD BLOOD LEFT FOREARM  Final   Special Requests   Final    BOTTLES DRAWN AEROBIC AND ANAEROBIC Blood Culture adequate volume   Culture   Final    NO GROWTH 2 DAYS Performed at [facility], [address] [place], [place] [address]    Report Status PENDING  Incomplete  CULTURE, BLOOD (ROUTINE X 2) w Reflex to ID Panel     Status: None (Preliminary result)   Collection Time: 03/14/20  6:24 PM   Specimen: BLOOD  Result Value Ref Range Status   Specimen Description BLOOD LEFT ANTECUBITAL  Final   Special Requests   Final    BOTTLES DRAWN AEROBIC AND ANAEROBIC Blood Culture adequate volume   Culture   Final    NO GROWTH 2 DAYS Performed at [facility], [address], [place], [place] [address]    Report Status PENDING  Incomplete     Labs: BNP (last 3 results) No results for input(s): BNP in the last 8760 hours. Basic Metabolic Panel: Recent Labs  Lab 03/14/20 1147 03/15/20 0434  NA 138  139  K 4.5 3.6  CL 102 105  CO2 26 26  GLUCOSE 170* 135*  BUN 19 19  CREATININE 0.98 0.79  CALCIUM 10.2 9.1   Liver Function Tests: Recent Labs  Lab 03/14/20 1147  AST 32  ALT 29  ALKPHOS 73  BILITOT 1.9*  PROT 7.7  ALBUMIN 4.2   Recent Labs  Lab 03/14/20 1147  LIPASE 25   No results for input(s): AMMONIA in the last 168 hours. CBC: Recent Labs  Lab 03/14/20 1147 03/14/20 1823 03/15/20 0434 03/16/20 0819  WBC 12.3*  --  12.7* 7.0  HGB 15.0 14.1 12.6 12.0  HCT 44.2 41.5 37.2 36.0  MCV 93.2  --  93.9 94.7  PLT 174  --  140* 117*   Cardiac Enzymes: No results for input(s): CKTOTAL, CKMB, CKMBINDEX, TROPONINI in the last 168 hours. BNP: Invalid input(s): POCBNP CBG: No results for input(s): GLUCAP  in the last 168 hours. D-Dimer No results for input(s): DDIMER in the last 72 hours. Hgb A1c No results for input(s): HGBA1C in the last 72 hours. Lipid Profile No results for input(s): CHOL, HDL, LDLCALC, TRIG, CHOLHDL, LDLDIRECT in the last 72 hours. Thyroid function studies No results for input(s): TSH, T4TOTAL, T3FREE, THYROIDAB in the last 72 hours.  Invalid input(s): FREET3 Anemia work up No results for input(s): VITAMINB12, FOLATE, FERRITIN, TIBC, IRON, RETICCTPCT in the last 72 hours. Urinalysis    Component Value Date/Time   COLORURINE YELLOW (A) 01/15/2016 1539   APPEARANCEUR HAZY (A) 01/15/2016 1539   LABSPEC 1.015 01/15/2016 1539   PHURINE 5.0 01/15/2016 1539   GLUCOSEU NEGATIVE 01/15/2016 1539   HGBUR NEGATIVE 01/15/2016 1539   BILIRUBINUR NEGATIVE 01/15/2016 1539   KETONESUR NEGATIVE 01/15/2016 1539   PROTEINUR NEGATIVE 01/15/2016 1539   NITRITE NEGATIVE 01/15/2016 1539   LEUKOCYTESUR 3+ (A) 01/15/2016 1539   Sepsis Labs Invalid input(s): PROCALCITONIN,  WBC,  LACTICIDVEN Microbiology Recent Results (from the past 240 hour(s))  SARS CORONAVIRUS 2 (TAT 6-24 HRS) Nasopharyngeal Nasopharyngeal Swab     Status: None   Collection Time: 03/14/20  6:23 PM   Specimen: Nasopharyngeal Swab  Result Value Ref Range Status   SARS Coronavirus 2 NEGATIVE NEGATIVE Final    Comment: (NOTE) SARS-CoV-2 target nucleic acids are NOT DETECTED. The SARS-CoV-2 RNA is generally detectable in upper and lower respiratory specimens during the acute phase of infection. Negative results do not preclude SARS-CoV-2 infection, do not rule out co-infections with other pathogens, and should not be used as the sole basis for treatment or other patient management decisions. Negative results must be combined with clinical observations, patient history, and epidemiological information. The expected result is Negative. Fact Sheet for Patients: SugarRoll.be Fact  Sheet for Healthcare Providers: https://www.woods-mathews.com/ This test is not yet approved or cleared by the [place] FDA and  has been authorized for detection and/or diagnosis of SARS-CoV-2 by FDA under an Emergency Use Authorization (EUA). This EUA will remain  in effect (meaning this test can be used) for the duration of the COVID-19 declaration under Section 56 4(b)(1) of the Act, 21 U.S.C. section 360bbb-3(b)(1), unless the authorization is terminated or revoked sooner. Performed at [facility], [place] [address], [place], [place] [address]   CULTURE, BLOOD (ROUTINE X 2) w Reflex to ID Panel     Status: None (Preliminary result)   Collection Time: 03/14/20  6:23 PM   Specimen: BLOOD  Result Value Ref Range Status   Specimen Description BLOOD BLOOD LEFT FOREARM  Final   Special Requests   Final    BOTTLES DRAWN AEROBIC AND ANAEROBIC Blood Culture adequate volume   Culture   Final    NO GROWTH 2 DAYS Performed at [facility], [address] [place], [place] [address]    Report Status PENDING  Incomplete  CULTURE, BLOOD (ROUTINE X 2) w Reflex to ID Panel     Status: None (Preliminary result)   Collection Time: 03/14/20  6:24 PM   Specimen: BLOOD  Result Value Ref Range Status   Specimen Description BLOOD LEFT ANTECUBITAL  Final   Special Requests   Final    BOTTLES DRAWN AEROBIC AND ANAEROBIC Blood Culture adequate volume   Culture   Final    NO GROWTH 2 DAYS Performed at [facility], [address], [place], [place] [address]    Report Status PENDING  Incomplete     Time coordinating discharge: Over 30 minutes  SIGNED:   Nolberto Hanlon, MD  [facility] 03/16/2020, 3:57 PM Pager   If 7PM-7AM, please contact night-coverage [email or web address] Password [email or web address]

## 2020-03-17 NOTE — Anesthesia Postprocedure Evaluation (Signed)
Anesthesia Post Note  Patient: Marissa Mullen  Procedure(s) Performed: COLONOSCOPY WITH PROPOFOL (N/A )  Patient location during evaluation: Endoscopy Anesthesia Type: General Level of consciousness: awake and alert and oriented Pain management: pain level controlled Vital Signs Assessment: post-procedure vital signs reviewed and stable Respiratory status: spontaneous breathing Cardiovascular status: blood pressure returned to baseline Anesthetic complications: no     Last Vitals:  Vitals:   03/16/20 1421 03/16/20 1748  BP:  133/85  Pulse: 86 (!) 109  Resp: 14   Temp:  37.3 C  SpO2: 98% 98%    Last Pain:  Vitals:   03/16/20 1748  TempSrc: Oral  PainSc:                  Estevon Fluke

## 2020-03-18 ENCOUNTER — Encounter: Admission: RE | Payer: Self-pay | Source: Home / Self Care

## 2020-03-18 ENCOUNTER — Encounter: Payer: Self-pay | Admitting: *Deleted

## 2020-03-18 ENCOUNTER — Ambulatory Visit: Admission: RE | Admit: 2020-03-18 | Payer: Medicare Other | Source: Home / Self Care | Admitting: Internal Medicine

## 2020-03-18 SURGERY — COLONOSCOPY WITH PROPOFOL
Anesthesia: General

## 2020-03-19 LAB — CULTURE, BLOOD (ROUTINE X 2)
Culture: NO GROWTH
Culture: NO GROWTH
Special Requests: ADEQUATE
Special Requests: ADEQUATE

## 2020-03-19 LAB — SURGICAL PATHOLOGY

## 2020-03-20 LAB — STOOL CULTURE REFLEX - CMPCXR

## 2020-03-20 LAB — STOOL CULTURE REFLEX - RSASHR

## 2020-03-20 LAB — STOOL CULTURE: E coli, Shiga toxin Assay: NEGATIVE

## 2020-05-07 ENCOUNTER — Other Ambulatory Visit: Payer: Self-pay | Admitting: Family Medicine

## 2020-05-07 DIAGNOSIS — Z1231 Encounter for screening mammogram for malignant neoplasm of breast: Secondary | ICD-10-CM

## 2020-10-23 ENCOUNTER — Ambulatory Visit
Admission: RE | Admit: 2020-10-23 | Discharge: 2020-10-23 | Disposition: A | Discharge status: Home / Self Care | Payer: Medicare Other | Source: Ambulatory Visit | Attending: Family Medicine | Admitting: Family Medicine

## 2020-10-23 ENCOUNTER — Other Ambulatory Visit: Payer: Self-pay

## 2020-10-23 DIAGNOSIS — Z1231 Encounter for screening mammogram for malignant neoplasm of breast: Secondary | ICD-10-CM | POA: Insufficient documentation

## 2021-09-18 ENCOUNTER — Other Ambulatory Visit: Payer: Self-pay | Admitting: Family Medicine

## 2021-09-18 DIAGNOSIS — Z1231 Encounter for screening mammogram for malignant neoplasm of breast: Secondary | ICD-10-CM

## 2021-09-25 ENCOUNTER — Encounter: Payer: Self-pay | Admitting: *Deleted

## 2021-09-26 ENCOUNTER — Ambulatory Visit: Payer: Medicare Other | Admitting: Anesthesiology

## 2021-09-26 ENCOUNTER — Encounter
Admission: RE | Disposition: A | Discharge status: Home / Self Care | Payer: Self-pay | Source: Home / Self Care | Attending: Gastroenterology

## 2021-09-26 ENCOUNTER — Ambulatory Visit
Admission: RE | Admit: 2021-09-26 | Discharge: 2021-09-26 | Disposition: A | Discharge status: Home / Self Care | Payer: Medicare Other | Attending: Gastroenterology | Admitting: Gastroenterology

## 2021-09-26 DIAGNOSIS — K635 Polyp of colon: Secondary | ICD-10-CM | POA: Diagnosis present

## 2021-09-26 DIAGNOSIS — D120 Benign neoplasm of cecum: Secondary | ICD-10-CM | POA: Diagnosis not present

## 2021-09-26 DIAGNOSIS — K5730 Diverticulosis of large intestine without perforation or abscess without bleeding: Secondary | ICD-10-CM | POA: Diagnosis not present

## 2021-09-26 DIAGNOSIS — Z87891 Personal history of nicotine dependence: Secondary | ICD-10-CM | POA: Insufficient documentation

## 2021-09-26 DIAGNOSIS — K219 Gastro-esophageal reflux disease without esophagitis: Secondary | ICD-10-CM | POA: Diagnosis not present

## 2021-09-26 DIAGNOSIS — Z79899 Other long term (current) drug therapy: Secondary | ICD-10-CM | POA: Diagnosis not present

## 2021-09-26 DIAGNOSIS — I10 Essential (primary) hypertension: Secondary | ICD-10-CM | POA: Diagnosis not present

## 2021-09-26 DIAGNOSIS — D124 Benign neoplasm of descending colon: Secondary | ICD-10-CM | POA: Diagnosis not present

## 2021-09-26 DIAGNOSIS — E7800 Pure hypercholesterolemia, unspecified: Secondary | ICD-10-CM | POA: Insufficient documentation

## 2021-09-26 DIAGNOSIS — Z888 Allergy status to other drugs, medicaments and biological substances status: Secondary | ICD-10-CM | POA: Insufficient documentation

## 2021-09-26 DIAGNOSIS — Z885 Allergy status to narcotic agent status: Secondary | ICD-10-CM | POA: Diagnosis not present

## 2021-09-26 DIAGNOSIS — K640 First degree hemorrhoids: Secondary | ICD-10-CM | POA: Diagnosis not present

## 2021-09-26 HISTORY — DX: Polyp of colon: K63.5

## 2021-09-26 HISTORY — DX: Diverticulosis of intestine, part unspecified, without perforation or abscess without bleeding: K57.90

## 2021-09-26 HISTORY — DX: Esophageal obstruction: K22.2

## 2021-09-26 HISTORY — PX: COLONOSCOPY WITH PROPOFOL: SHX5780

## 2021-09-26 SURGERY — COLONOSCOPY WITH PROPOFOL
Anesthesia: General

## 2021-09-26 MED ORDER — SODIUM CHLORIDE 0.9 % IV SOLN: INTRAVENOUS | Status: DC

## 2021-09-26 MED ORDER — PROPOFOL 10 MG/ML IV BOLUS
INTRAVENOUS | Status: DC | PRN
  Administered 2021-09-26: 70 mg via INTRAVENOUS

## 2021-09-26 MED ORDER — LIDOCAINE HCL (CARDIAC) PF 100 MG/5ML IV SOSY
PREFILLED_SYRINGE | INTRAVENOUS | Status: DC | PRN
  Administered 2021-09-26: 40 mg via INTRAVENOUS

## 2021-09-26 MED ORDER — PROPOFOL 500 MG/50ML IV EMUL
INTRAVENOUS | Status: DC | PRN
  Administered 2021-09-26: 120 ug/kg/min via INTRAVENOUS

## 2021-09-26 NOTE — Transfer of Care (Signed)
Immediate Anesthesia Transfer of Care Note  Patient: Marissa Mullen  Procedure(s) Performed: COLONOSCOPY WITH PROPOFOL  Patient Location: PACU  Anesthesia Type:General  Level of Consciousness: awake, alert  and oriented  Airway & Oxygen Therapy: Patient Spontanous Breathing  Post-op Assessment: Report given to RN and Post -op Vital signs reviewed and stable  Post vital signs: Reviewed and stable  Last Vitals:  Vitals Value Taken Time  BP 104/62 09/26/21 0813  Temp 36 C 09/26/21 0813  Pulse 82 09/26/21 0813  Resp 20 09/26/21 0813  SpO2 95 % 09/26/21 0813  Vitals shown include unvalidated device data.  Last Pain:  Vitals:   09/26/21 0813  TempSrc: Temporal  PainSc: Asleep         Complications: No notable events documented.

## 2021-09-26 NOTE — H&P (Signed)
Outpatient short stay form Pre-procedure 09/26/2021  Lesly Rubenstein, MD  Primary Physician: [place] Pink, MD  Reason for visit:  Polyp of colon  History of present illness:   80 y/o lady with history of ischemic colitis in 2021. Had a polyp at that time not removed due to illness. Patient with no blood thinners. History of hysterectomy. No family history of GI malignancies.    Current Facility-Administered Medications:    0.9 %  sodium chloride infusion, , Intravenous, Continuous, Allen Basista, Hilton Cork, MD  Medications Prior to Admission  Medication Sig Dispense Refill Last Dose   acetaminophen (TYLENOL) 650 MG CR tablet Take 650-1,300 mg by mouth every 8 (eight) hours as needed for pain.    09/25/2021   Calcium Carb-Cholecalciferol 600-800 MG-UNIT TABS Take 2 tablets by mouth daily.   09/25/2021   Multiple Vitamins-Minerals (WOMENS MULTIVITAMIN PLUS) TABS Take 1 tablet by mouth daily.   09/25/2021   pantoprazole (PROTONIX) 40 MG tablet Take 40 mg by mouth daily.   09/25/2021   simvastatin (ZOCOR) 20 MG tablet Take 20 mg by mouth daily.   09/25/2021     Allergies  Allergen Reactions   Ondansetron     Other reaction(s): Abdominal Pain   Oxycodone Itching   Tramadol Itching    Unknown      Past Medical History:  Diagnosis Date   Arthritis    Colon polyps    Diverticulosis    Esophageal stricture    GERD (gastroesophageal reflux disease)    Hepatitis    "Age 74"   Hypercholesterolemia    Hypertension     Review of systems:  Otherwise negative.    Physical Exam  Gen: Alert, oriented. Appears stated age.  HEENT: PERRLA. Lungs: No respiratory distress CV: RRR Abd: soft, benign, no masses Ext: No edema    Planned procedures: Proceed with colonoscopy. The patient understands the nature of the planned procedure, indications, risks, alternatives and potential complications including but not limited to bleeding, infection, perforation, damage to internal organs and  possible oversedation/side effects from anesthesia. The patient agrees and gives consent to proceed.  Please refer to procedure notes for findings, recommendations and patient disposition/instructions.     Lesly Rubenstein, MD [facility]

## 2021-09-26 NOTE — Interval H&P Note (Signed)
History and Physical Interval Note:  09/26/2021 7:48 AM  Marissa Mullen  has presented today for surgery, with the diagnosis of PERSONAL HX.OF COLON POLYPS.  The various methods of treatment have been discussed with the patient and family. After consideration of risks, benefits and other options for treatment, the patient has consented to  Procedure(s): COLONOSCOPY WITH PROPOFOL (N/A) as a surgical intervention.  The patient's history has been reviewed, patient examined, no change in status, stable for surgery.  I have reviewed the patient's chart and labs.  Questions were answered to the patient's satisfaction.     Lesly Rubenstein  Ok to proceed with colonoscopy

## 2021-09-26 NOTE — Op Note (Signed)
[facility] Gastroenterology Patient Name: Marissa Mullen Procedure Date: 09/26/2021 6:46 AM MRN: [id number] Account #: [id number] Date of Birth: 23-Jul-1941 Admit Type: Outpatient Age: 80 Room: [facility] ENDO ROOM 3 Gender: Female Note Status: Finalized Instrument Name: Jasper Riling [id number] Procedure:             Colonoscopy Indications:           Colon polyps of uncertain behavior Providers:             Andrey Farmer MD, MD Referring MD:          Irven Easterly. Kary Kos, MD (Referring MD) Medicines:             Monitored Anesthesia Care Complications:         No immediate complications. Estimated blood loss:                         Minimal. Procedure:             Pre-Anesthesia Assessment:                        - Prior to the procedure, a History and Physical was                         performed, and patient medications and allergies were                         reviewed. The patient is competent. The risks and                         benefits of the procedure and the sedation options and                         risks were discussed with the patient. All questions                         were answered and informed consent was obtained.                         Patient identification and proposed procedure were                         verified by the physician, the nurse, the anesthetist                         and the technician in the endoscopy suite. Mental                         Status Examination: alert and oriented. Airway                         Examination: normal oropharyngeal airway and neck                         mobility. Respiratory Examination: clear to                         auscultation. CV Examination: normal. Prophylactic  Antibiotics: The patient does not require prophylactic                         antibiotics. Prior Anticoagulants: The patient has                         taken no previous anticoagulant or antiplatelet agents                          except for aspirin. ASA Grade Assessment: II - A                         patient with mild systemic disease. After reviewing                         the risks and benefits, the patient was deemed in                         satisfactory condition to undergo the procedure. The                         anesthesia plan was to use monitored anesthesia care                         (MAC). Immediately prior to administration of                         medications, the patient was re-assessed for adequacy                         to receive sedatives. The heart rate, respiratory                         rate, oxygen saturations, blood pressure, adequacy of                         pulmonary ventilation, and response to care were                         monitored throughout the procedure. The physical                         status of the patient was re-assessed after the                         procedure.                        After obtaining informed consent, the colonoscope was                         passed under direct vision. Throughout the procedure,                         the patient's blood pressure, pulse, and oxygen                         saturations were monitored continuously. The  Colonoscope was introduced through the anus and                         advanced to the the cecum, identified by appendiceal                         orifice and ileocecal valve. The colonoscopy was                         somewhat difficult due to significant looping.                         Successful completion of the procedure was aided by                         applying abdominal pressure. The patient tolerated the                         procedure well. The quality of the bowel preparation                         was adequate to identify polyps. Findings:      The perianal and digital rectal examinations were normal.      A 5 mm polyp was found in the  cecum. The polyp was sessile. The polyp       was removed with a cold snare. Resection and retrieval were complete.       Estimated blood loss was minimal.      A 2 mm polyp was found in the descending colon. The polyp was sessile.       The polyp was removed with a jumbo cold forceps. Resection and retrieval       were complete. Estimated blood loss was minimal.      Multiple small-mouthed diverticula were found in the sigmoid colon.      Internal hemorrhoids were found during retroflexion. The hemorrhoids       were Grade I (internal hemorrhoids that do not prolapse).      The exam was otherwise without abnormality on direct and retroflexion       views. Impression:            - One 5 mm polyp in the cecum, removed with a cold                         snare. Resected and retrieved.                        - One 2 mm polyp in the descending colon, removed with                         a jumbo cold forceps. Resected and retrieved.                        - Diverticulosis in the sigmoid colon.                        - Internal hemorrhoids.                        - The examination was  otherwise normal on direct and                         retroflexion views. Recommendation:        - Discharge patient to home.                        - Resume previous diet.                        - Continue present medications.                        - Await pathology results.                        - Repeat colonoscopy is not recommended due to current                         age (39 years or older) for surveillance.                        - Return to referring physician as previously                         scheduled. Procedure Code(s):     --- Professional ---                        [phone], Colonoscopy, flexible; with removal of                         tumor(s), polyp(s), or other lesion(s) by snare                         technique                        45380, 80, Colonoscopy, flexible; with biopsy, single                          or multiple Diagnosis Code(s):     --- Professional ---                        K63.5, Polyp of colon                        K64.0, First degree hemorrhoids                        D37.4, Neoplasm of uncertain behavior of colon                        K57.30, Diverticulosis of large intestine without                         perforation or abscess without bleeding CPT copyright 2019 American Medical Association. All rights reserved. The codes documented in this report are preliminary and upon coder review may  be revised to meet current compliance requirements. Andrey Farmer MD, MD 09/26/2021 8:13:43 AM Number of Addenda: 0 Note Initiated On: 09/26/2021 6:46 AM Scope Withdrawal Time: 0 hours 9 minutes 34  seconds  Total Procedure Duration: 0 hours 17 minutes 52 seconds  Estimated Blood Loss:  Estimated blood loss was minimal.      [facility]

## 2021-09-26 NOTE — Anesthesia Postprocedure Evaluation (Signed)
Anesthesia Post Note  Patient: Marissa Mullen  Procedure(s) Performed: COLONOSCOPY WITH PROPOFOL  Patient location during evaluation: Endoscopy Anesthesia Type: General Level of consciousness: awake and alert Pain management: pain level controlled Vital Signs Assessment: post-procedure vital signs reviewed and stable Respiratory status: spontaneous breathing, nonlabored ventilation and respiratory function stable Cardiovascular status: blood pressure returned to baseline and stable Postop Assessment: no apparent nausea or vomiting Anesthetic complications: no   No notable events documented.   Last Vitals:  Vitals:   09/26/21 0813 09/26/21 0843  BP: 104/62 113/71  Pulse:    Resp:    Temp: (!) 36 C   SpO2:      Last Pain:  Vitals:   09/26/21 0843  TempSrc:   PainSc: 0-No pain                 Iran Ouch

## 2021-09-26 NOTE — Anesthesia Preprocedure Evaluation (Addendum)
Anesthesia Evaluation  Patient identified by MRN, date of birth, ID band Patient awake    Reviewed: Allergy & Precautions, H&P , NPO status , Patient's Chart, lab work & pertinent test results, reviewed documented beta blocker date and time   Airway Mallampati: II   Neck ROM: full    Dental  (+) Poor Dentition   Pulmonary neg pulmonary ROS, former smoker,    Pulmonary exam normal        Cardiovascular METS: 3 - Mets hypertension, Pt. on medications and Pt. on home beta blockers Normal cardiovascular exam     Neuro/Psych negative neurological ROS  negative psych ROS   GI/Hepatic GERD  Medicated and Controlled,(+) Hepatitis -Esophageal stricture   Endo/Other  negative endocrine ROS  Renal/GU negative Renal ROS  negative genitourinary   Musculoskeletal   Abdominal Normal abdominal exam  (+)   Peds negative pediatric ROS (+)  Hematology negative hematology ROS (+)   Anesthesia Other Findings Past Medical History:   Hypertension                                                 GERD (gastroesophageal reflux disease)                       Arthritis                                                    Hepatitis                                                      Comment:"Age 80"   Hypercholesterolemia                                       Past Surgical History:   ABDOMINAL HYSTERECTOMY                                      BMI    Body Mass Index   29.16 kg/m 2     Reproductive/Obstetrics                            Anesthesia Physical  Anesthesia Plan  ASA: III and emergent  Anesthesia Plan: General   Post-op Pain Management:    Induction:   PONV Risk Score and Plan: Propofol infusion  Airway Management Planned: Nasal Cannula and Natural Airway  Additional Equipment:   Intra-op Plan:   Post-operative Plan:   Informed Consent: I have reviewed the patients History and Physical,  chart, labs and discussed the procedure including the risks, benefits and alternatives for the proposed anesthesia with the patient or authorized representative who has indicated his/her understanding and acceptance.     Dental Advisory Given  Plan Discussed with: CRNA and Anesthesiologist  Anesthesia Plan Comments:  Anesthesia Quick Evaluation  

## 2021-09-29 ENCOUNTER — Encounter: Payer: Self-pay | Admitting: Gastroenterology

## 2021-09-29 LAB — SURGICAL PATHOLOGY

## 2021-10-28 ENCOUNTER — Ambulatory Visit
Admission: RE | Admit: 2021-10-28 | Discharge: 2021-10-28 | Disposition: A | Discharge status: Home / Self Care | Payer: Medicare Other | Source: Ambulatory Visit | Attending: Family Medicine | Admitting: Family Medicine

## 2021-10-28 ENCOUNTER — Other Ambulatory Visit: Payer: Self-pay

## 2021-10-28 DIAGNOSIS — Z1231 Encounter for screening mammogram for malignant neoplasm of breast: Secondary | ICD-10-CM | POA: Insufficient documentation

## 2021-12-30 DIAGNOSIS — H25812 Combined forms of age-related cataract, left eye: Secondary | ICD-10-CM | POA: Diagnosis not present

## 2021-12-30 DIAGNOSIS — H25012 Cortical age-related cataract, left eye: Secondary | ICD-10-CM | POA: Diagnosis not present

## 2021-12-30 DIAGNOSIS — H2512 Age-related nuclear cataract, left eye: Secondary | ICD-10-CM | POA: Diagnosis not present

## 2022-01-06 DIAGNOSIS — H2512 Age-related nuclear cataract, left eye: Secondary | ICD-10-CM | POA: Diagnosis not present

## 2022-05-05 DIAGNOSIS — I10 Essential (primary) hypertension: Secondary | ICD-10-CM | POA: Diagnosis not present

## 2022-05-05 DIAGNOSIS — K559 Vascular disorder of intestine, unspecified: Secondary | ICD-10-CM | POA: Diagnosis not present

## 2022-05-05 DIAGNOSIS — E7800 Pure hypercholesterolemia, unspecified: Secondary | ICD-10-CM | POA: Diagnosis not present

## 2022-07-28 DIAGNOSIS — H1045 Other chronic allergic conjunctivitis: Secondary | ICD-10-CM | POA: Diagnosis not present

## 2022-07-28 DIAGNOSIS — H33322 Round hole, left eye: Secondary | ICD-10-CM | POA: Diagnosis not present

## 2022-07-28 DIAGNOSIS — H26493 Other secondary cataract, bilateral: Secondary | ICD-10-CM | POA: Diagnosis not present

## 2022-08-03 ENCOUNTER — Ambulatory Visit (INDEPENDENT_AMBULATORY_CARE_PROVIDER_SITE_OTHER): Payer: Medicare HMO | Admitting: Ophthalmology

## 2022-08-03 ENCOUNTER — Encounter (INDEPENDENT_AMBULATORY_CARE_PROVIDER_SITE_OTHER): Payer: Self-pay | Admitting: Ophthalmology

## 2022-08-03 DIAGNOSIS — H35723 Serous detachment of retinal pigment epithelium, bilateral: Secondary | ICD-10-CM

## 2022-08-03 DIAGNOSIS — H35033 Hypertensive retinopathy, bilateral: Secondary | ICD-10-CM

## 2022-08-03 DIAGNOSIS — Z961 Presence of intraocular lens: Secondary | ICD-10-CM | POA: Diagnosis not present

## 2022-08-03 DIAGNOSIS — I10 Essential (primary) hypertension: Secondary | ICD-10-CM | POA: Diagnosis not present

## 2022-08-03 NOTE — Progress Notes (Signed)
[facility] Note  08/03/2022     CHIEF COMPLAINT Patient presents for Retina Evaluation   HISTORY OF PRESENT ILLNESS: Marissa Mullen is a 81 y.o. female who presents to the clinic today for:   HPI     Retina Evaluation   In both eyes.  I, the attending physician,  performed the HPI with the patient and updated documentation appropriately.        Comments   Patient here for Retina evaluation. Referred by Dr. Ellin Mayhew. Patient states vision is perfect. Saw Dr Ellin Mayhew last week saw a freckle. It opened up released dry blood. And fresh blood. No eye pain. Has had cataract surgery OU. Vision is mono vision OD Near and OS distance. Used pataday prn.       Last edited by Bernarda Caffey, MD on 08/03/2022 12:35 PM.    Pt is here on the referral of Dr.Woodard for concern of operculated hole, pt states she has seen him for several years and the last exam was a routine exam, pt has had cataract sx with Dr. Herbert Deaner, pt states she has had the same floater for years, she sees occasional fol, she is no longer wearing glasses since cat sx, pt is hypertensive, but denies being diabetic  Referring physician: Anell Barr, [place],  [place] [id number]  HISTORICAL INFORMATION:   Selected notes from the MEDICAL RECORD NUMBER Referred by Dr. Ellin Mayhew for concern of operculated hole LEE:  Ocular Hx- PMH-    CURRENT MEDICATIONS: No current outpatient medications on file. (Ophthalmic Drugs)   No current facility-administered medications for this visit. (Ophthalmic Drugs)   Current Outpatient Medications (Other)  Medication Sig   acetaminophen (TYLENOL) 650 MG CR tablet Take 650-1,300 mg by mouth every 8 (eight) hours as needed for pain.    Calcium Carb-Cholecalciferol 600-800 MG-UNIT TABS Take 2 tablets by mouth daily.   Multiple Vitamins-Minerals (WOMENS MULTIVITAMIN PLUS) TABS Take 1 tablet by mouth daily.   pantoprazole (PROTONIX) 40 MG tablet Take  40 mg by mouth daily.   simvastatin (ZOCOR) 20 MG tablet Take 20 mg by mouth daily.   No current facility-administered medications for this visit. (Other)   REVIEW OF SYSTEMS: ROS   Positive for: Cardiovascular, Eyes Last edited by Theodore Demark, COA on 08/03/2022 10:30 AM.     ALLERGIES Allergies  Allergen Reactions   Ondansetron     Other reaction(s): Abdominal Pain   Oxycodone Itching   Tramadol Itching    Unknown    PAST MEDICAL HISTORY Past Medical History:  Diagnosis Date   Arthritis    Colon polyps    Diverticulosis    Esophageal stricture    GERD (gastroesophageal reflux disease)    Hepatitis    "Age 6"   Hypercholesterolemia    Hypertension    Past Surgical History:  Procedure Laterality Date   ABDOMINAL HYSTERECTOMY     CATARACT EXTRACTION     COLONOSCOPY WITH PROPOFOL N/A 03/16/2020   Procedure: COLONOSCOPY WITH PROPOFOL;  Surgeon: Toledo, Benay Pike, MD;  Location: [facility] ENDOSCOPY;  Service: Gastroenterology;  Laterality: N/A;   COLONOSCOPY WITH PROPOFOL N/A 09/26/2021   Procedure: COLONOSCOPY WITH PROPOFOL;  Surgeon: Lesly Rubenstein, MD;  Location: [facility] ENDOSCOPY;  Service: Endoscopy;  Laterality: N/A;   PARTIAL KNEE ARTHROPLASTY Left 01/23/2016   Procedure: UNICOMPARTMENTAL KNEE;  Surgeon: Corky Mull, MD;  Location: [facility] ORS;  Service: Orthopedics;  Laterality: Left;   FAMILY HISTORY  Family History  Problem Relation Age of Onset   Diabetes Mother    Diabetes Daughter    Breast cancer Daughter 20   SOCIAL HISTORY Social History   Tobacco Use   Smoking status: Former    Packs/day: 0.50    Types: Cigarettes   Smokeless tobacco: Never  Vaping Use   Vaping Use: Never used  Substance Use Topics   Alcohol use: No   Drug use: No       OPHTHALMIC EXAM:  Base Eye Exam     Visual Acuity (Snellen - Linear)       Right Left   Dist [place] 20/25 +2 20/20 -2  Patient is mono vision OD Near used JCard. OS Distance        Tonometry  (Tonopen, 10:25 AM)       Right Left   Pressure 10 09         Pupils       Dark Light Shape React APD   Right 3 2 Round Brisk None   Left 3 2 Round Brisk None         Visual Fields (Counting fingers)       Left Right    Full Full         Extraocular Movement       Right Left    Full, Ortho Full, Ortho         Neuro/Psych     Oriented x3: Yes   Mood/Affect: Normal         Dilation     Both eyes: 1.0% Mydriacyl, 2.5% Phenylephrine @ 10:25 AM           Slit Lamp and Fundus Exam     Slit Lamp Exam       Right Left   Lids/Lashes Dermatochalasis - upper lid Dermatochalasis - upper lid   Conjunctiva/Sclera White and quiet White and quiet   Cornea tear film debris, well healed cataract wound tear film debris, well healed cataract wound   Anterior Chamber Deep and quiet Deep and quiet   Iris Round and dilated Round and dilated   Lens PC IOL in good position PC IOL in good position   Anterior Vitreous Vitreous syneresis Vitreous syneresis         Fundus Exam       Right Left   Disc Pink and Sharp, Compact Pink and Sharp, Compact   C/D Ratio 0.1 0.1   Macula Flat, Good foveal reflex, RPE mottling, No heme or edema Flat, Good foveal reflex, RPE mottling, No heme or edema   Vessels attenuated, Tortuous, mild AV crossing changes, mild copper wiring attenuated, Tortuous, mild AV crossing changes, mild copper wiring   Periphery Attached, focal PED w/ SRH at 1200; no RT/RD Attached, mild reticular degeneration, 2 focal SRH (one bright red, one darker) at 0130 midzone, no other heme; no RT/RD           IMAGING AND PROCEDURES  Imaging and Procedures for 08/03/2022  OCT, Retina - OU - Both Eyes       Right Eye Quality was good. Central Foveal Thickness: 281. Progression has no prior data. Findings include normal foveal contour, no IRF, no SRF, vitreomacular adhesion (Focal PED superior equator caught on high density widefield -- no fluid).   Left  Eye Quality was good. Central Foveal Thickness: 309. Progression has no prior data. Findings include normal foveal contour, no IRF, no SRF, pigment epithelial detachment, vitreomacular adhesion (Small, focal PED  IN to fovea, 2 focal PEDs ST periphery caught on high density widefield, -- focal pocket of SRF overlying more nasal PED, no IRF).   Notes *Images captured and stored on drive  Diagnosis / Impression:  NFP; no IRF/SRF centrally OU OD: Focal PED superior equator caught on high density widefield -- no fluid OS: Small, focal PED IN to fovea, 2 focal PEDs ST periphery caught on high density widefield, -- focal pocket of SRF overlying more nasal PED, no IRF  Clinical management:  See below  Abbreviations: NFP - Normal foveal profile. CME - cystoid macular edema. PED - pigment epithelial detachment. IRF - intraretinal fluid. SRF - subretinal fluid. EZ - ellipsoid zone. ERM - epiretinal membrane. ORA - outer retinal atrophy. ORT - outer retinal tubulation. SRHM - subretinal hyper-reflective material. 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 Essential hypertension  I10     3. Hypertensive retinopathy of both eyes  H35.033     4. Pseudophakia, both eyes  Z96.1      PED OU  - exam shows focal PEDs w/ +subretinal heme OU  - OD 1200 equator  - OS 0130 midzone  - OCT shows OD: Focal PED superior equator caught on high density widefield -- no fluid; OS: Small, focal PED IN to fovea, 2 focal PEDs ST periphery caught on high density widefield, -- focal pocket of SRF overlying more nasal PED, no IRF  - BCVA 20/25 OD, 20/20 OS -- asymptomatic  - no retinal tear or retinal detachment  - discussed findings, prognosis  - unclear etiology of focal PEDs -- differential includes ARMD variant, PEHCR, PCV  - f/u 4 weeks, DFE, OCT -- high density  OCT (1200 OD, 0130 OS)  2,3. Hypertensive retinopathy OU - discussed importance of tight BP control - monitor  4. Pseudophakia OU  - s/p CE/IOL OU  - IOLs in good position  - monitor  Ophthalmic Meds Ordered this visit:  No orders of the defined types were placed in this encounter.    Return in about 4 weeks (around 08/31/2022) for f/u 4 weeks, PED OU, DFE, OCT.  There are no Patient Instructions on file for this visit.   Explained the diagnoses, plan, and follow up with the patient and they expressed understanding.  Patient expressed understanding of the importance of proper follow up care.   This document serves as a record of services personally performed by Gardiner Sleeper, MD, PhD. It was created on their behalf by San Jetty. Owens Shark, OA an ophthalmic technician. The creation of this record is the provider's dictation and/or activities during the visit.    Electronically signed by: San Jetty. Marguerita Merles 08.07.2023 12:43 PM  Gardiner Sleeper, M.D., Ph.D. Diseases & Surgery of the Retina and Vitreous [facility]  I have reviewed the above documentation for accuracy and completeness, and I agree with the above. Gardiner Sleeper, M.D., Ph.D. 08/03/22 12:43 PM  Abbreviations: M myopia (nearsighted); A astigmatism; H hyperopia (farsighted); P presbyopia; Mrx spectacle prescription;  CTL contact lenses; OD right eye; OS left eye; OU both eyes  XT exotropia; ET esotropia; PEK punctate epithelial keratitis; PEE punctate epithelial erosions; DES dry eye syndrome; MGD meibomian gland dysfunction; ATs artificial tears; PFAT's preservative free artificial tears;  [place] nuclear sclerotic cataract; PSC posterior subcapsular cataract; ERM epi-retinal membrane; PVD posterior vitreous detachment; RD retinal detachment; DM diabetes mellitus; DR diabetic retinopathy; NPDR non-proliferative diabetic retinopathy; PDR proliferative diabetic retinopathy; CSME clinically significant macular  edema; DME diabetic macular edema; dbh dot blot hemorrhages; CWS cotton wool spot; POAG primary open angle glaucoma; C/D cup-to-disc ratio; HVF humphrey visual field; GVF goldmann visual field; OCT optical coherence tomography; IOP intraocular pressure; BRVO Branch retinal vein occlusion; CRVO central retinal vein occlusion; CRAO central retinal artery occlusion; BRAO branch retinal artery occlusion; RT retinal tear; SB scleral buckle; PPV pars plana vitrectomy; VH Vitreous hemorrhage; PRP panretinal laser photocoagulation; IVK intravitreal kenalog; VMT vitreomacular traction; MH Macular hole;  NVD neovascularization of the disc; NVE neovascularization elsewhere; AREDS age related eye disease study; ARMD age related macular degeneration; POAG primary open angle glaucoma; EBMD epithelial/anterior basement membrane dystrophy; ACIOL anterior chamber intraocular lens; IOL intraocular lens; PCIOL posterior chamber intraocular lens; Phaco/IOL phacoemulsification with intraocular lens placement; [place] photorefractive keratectomy; LASIK laser assisted in situ keratomileusis; HTN hypertension; DM diabetes mellitus; COPD chronic obstructive pulmonary disease

## 2022-09-03 NOTE — Progress Notes (Shared)
[facility] Note  09/07/2022     CHIEF COMPLAINT Patient presents for Retina Follow Up   HISTORY OF PRESENT ILLNESS: Marissa Mullen is a 81 y.o. female who presents to the clinic today for:   HPI     Retina Follow Up   Patient presents with  Other.  In both eyes.  Severity is mild.  Duration of 4 weeks.  Since onset it is stable.  I, the attending physician,  performed the HPI with the patient and updated documentation appropriately.        Comments   4 week Retina eval. Patient states vision is doing very well      Last edited by Bernarda Caffey, MD on 09/07/2022 12:23 PM.     Referring physician: Anell Barr, [place],  [place] [address]  HISTORICAL INFORMATION:   Selected notes from the MEDICAL RECORD NUMBER Referred by Dr. Ellin Mayhew for concern of operculated hole LEE:  Ocular Hx- PMH-    CURRENT MEDICATIONS: No current outpatient medications on file. (Ophthalmic Drugs)   No current facility-administered medications for this visit. (Ophthalmic Drugs)   Current Outpatient Medications (Other)  Medication Sig   acetaminophen (TYLENOL) 650 MG CR tablet Take 650-1,300 mg by mouth every 8 (eight) hours as needed for pain.    Calcium Carb-Cholecalciferol 600-800 MG-UNIT TABS Take 2 tablets by mouth daily.   pantoprazole (PROTONIX) 40 MG tablet Take 40 mg by mouth daily.   simvastatin (ZOCOR) 20 MG tablet Take 20 mg by mouth daily.   Multiple Vitamins-Minerals (WOMENS MULTIVITAMIN PLUS) TABS Take 1 tablet by mouth daily.   No current facility-administered medications for this visit. (Other)   REVIEW OF SYSTEMS: ROS   Positive for: Cardiovascular, Eyes Last edited by Elmore Guise, COT on 09/07/2022  8:58 AM.     ALLERGIES Allergies  Allergen Reactions   Ondansetron     Other reaction(s): Abdominal Pain   Oxycodone Itching   Tramadol Itching    Unknown    PAST MEDICAL HISTORY Past Medical History:  Diagnosis  Date   Arthritis    Colon polyps    Diverticulosis    Esophageal stricture    GERD (gastroesophageal reflux disease)    Hepatitis    "Age 86"   Hypercholesterolemia    Hypertension    Past Surgical History:  Procedure Laterality Date   ABDOMINAL HYSTERECTOMY     CATARACT EXTRACTION     COLONOSCOPY WITH PROPOFOL N/A 03/16/2020   Procedure: COLONOSCOPY WITH PROPOFOL;  Surgeon: Toledo, Benay Pike, MD;  Location: [facility];  Service: Gastroenterology;  Laterality: N/A;   COLONOSCOPY WITH PROPOFOL N/A 09/26/2021   Procedure: COLONOSCOPY WITH PROPOFOL;  Surgeon: Lesly Rubenstein, MD;  Location: [facility] ENDOSCOPY;  Service: Endoscopy;  Laterality: N/A;   PARTIAL KNEE ARTHROPLASTY Left 01/23/2016   Procedure: UNICOMPARTMENTAL KNEE;  Surgeon: Corky Mull, MD;  Location: [facility] ORS;  Service: Orthopedics;  Laterality: Left;   FAMILY HISTORY Family History  Problem Relation Age of Onset   Diabetes Mother    Diabetes Daughter    Breast cancer Daughter 21   SOCIAL HISTORY Social History   Tobacco Use   Smoking status: Former    Packs/day: 0.50    Types: Cigarettes   Smokeless tobacco: Never  Vaping Use   Vaping Use: Never used  Substance Use Topics   Alcohol use: No   Drug use: No       OPHTHALMIC EXAM:  Base Eye Exam     Visual Acuity (Snellen - Linear)       Right Left   Dist [place] 20/25+1 20/20   Dist ph [place] 20/20-3          Tonometry (Tonopen, 9:04 AM)       Right Left   Pressure 13 12         Pupils       Dark Light Shape React APD   Right 3 2 Round Brisk None   Left 3 2 Round Brisk None         Visual Fields (Counting fingers)       Left Right    Full Full         Extraocular Movement       Right Left    Full, Ortho Full, Ortho         Neuro/Psych     Oriented x3: Yes   Mood/Affect: Normal         Dilation     Both eyes: 1.0% Mydriacyl, 2.5% Phenylephrine @ 9:04 AM           Slit Lamp and Fundus Exam     Slit Lamp  Exam       Right Left   Lids/Lashes Dermatochalasis - upper lid Dermatochalasis - upper lid   Conjunctiva/Sclera White and quiet White and quiet   Cornea tear film debris, well healed cataract wound tear film debris, well healed cataract wound   Anterior Chamber Deep and quiet Deep and quiet   Iris Round and dilated Round and dilated   Lens PC IOL in good position PC IOL in good position   Anterior Vitreous Vitreous syneresis Vitreous syneresis         Fundus Exam       Right Left   Disc Pink and Sharp, Compact Pink and Sharp, Compact   C/D Ratio 0.1 0.1   Macula Flat, Good foveal reflex, RPE mottling, No heme or edema Flat, Good foveal reflex, RPE mottling, No heme or edema   Vessels attenuated, Tortuous attenuated, Tortuous   Periphery Attached, focal PED w/ SRH at 1200 -- mild interval increase in [facility]; no RT/RD Attached, mild reticular degeneration, 2 focal SRH (one bright red, one darker) at 0130 midzone, no other heme; no RT/RD           IMAGING AND PROCEDURES  Imaging and Procedures for 09/07/2022  OCT, Retina - OU - Both Eyes       Right Eye Quality was good. Central Foveal Thickness: 276. Progression has worsened. Findings include normal foveal contour, no IRF, no SRF, vitreomacular adhesion (PED superior periphery -- caught on high density widefield -- interval increase in PED size and interval development of SRF on temporal border).   Left Eye Quality was good. Central Foveal Thickness: 309. Progression has been stable. Findings include normal foveal contour, no IRF, no SRF, pigment epithelial detachment, vitreomacular adhesion (Small, focal PED IN to fovea, 2 focal PEDs ST periphery caught on high density widefield, -- focal pocket of SRF overlying more nasal PED, no IRF -- stable).   Notes *Images captured and stored on drive  Diagnosis / Impression:  NFP; no IRF/SRF centrally OU OD: PED superior periphery -- caught on high density widefield -- interval  increase in PED size and interval development of  SRF on temporal border OS: Small, focal PED IN to fovea, 2 focal PEDs ST periphery caught on high density widefield, -- focal pocket of  SRF overlying more nasal PED, no IRF -- stable  Clinical management:  See below  Abbreviations: NFP - Normal foveal profile. CME - cystoid macular edema. PED - pigment epithelial detachment. IRF - intraretinal fluid. SRF - subretinal fluid. EZ - ellipsoid zone. ERM - epiretinal membrane. ORA - outer retinal atrophy. ORT - outer retinal tubulation. SRHM - subretinal hyper-reflective material. IRHM - intraretinal hyper-reflective material            ASSESSMENT/PLAN:    ICD-10-CM   1. Exudative age-related macular degeneration of right eye with active choroidal neovascularization (HCC)  H35.3211 OCT, Retina - OU - Both Eyes    2. 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 Essential hypertension  I10     4. Hypertensive retinopathy of both eyes  H35.033     5. Pseudophakia, both eyes  Z96.1      1.  Exudative age related macular degeneration, OD  - interval increase in peripheral PED size and development of focal SRF superior periphery on 09.11.23 visit  - BCVA remains 20/20  - The incidence pathology and anatomy of wet AMD discussed   - discussed treatment options including observation vs intravitreal anti-VEGF agents such as Avastin, Lucentis, Eylea.    - Risks of endophthalmitis and vascular occlusive events and atrophic changes discussed with patient  - OCT shows PED superior periphery -- caught on high density widefield -- interval increase in PED size and interval development of  SRF on temporal border  - recommend IVA OD #1 - pt wishes to be treated with IVA, but prior authorization required from insurance - RBA of procedure discussed, questions answered  - f/u in 4 wks -- DFE/OCT, possible injection  2. PED OU  - exam shows focal PEDs w/  +subretinal heme OU  - OD 1200 equator  - OS 0130 midzone  - OCT shows OD: PED superior periphery -- caught on high density widefield -- interval increase in PED size and interval development of  SRF on temporal borderr; OS: Small, focal PED IN to fovea, 2 focal PEDs ST periphery caught on high density widefield, -- focal pocket of SRF overlying more nasal PED, no IRF -- stable  - BCVA 20/20 OD, 20/20 OS -- asymptomatic  - no retinal tear or retinal detachment  - discussed findings, prognosis  - unclear etiology of focal PEDs -- differential includes ARMD variant, PEHCR, PCV  - recommend IVA OD #1 today, 09.11.23 as above  - will bring pt back on Thursday, 09.14.23 for injection due to needing insurance auth  - f/u 3 days, DFE, OCT -- high density OCT (1200 OD, 0130 OS)  3,4. Hypertensive retinopathy OU - discussed importance of tight BP control - monitor  5. Pseudophakia OU  - s/p CE/IOL OU  - IOLs in good position  - monitor  Ophthalmic Meds Ordered this visit:  No orders of the defined types were placed in this encounter.    Return in about 3 days (around 09/10/2022) for f/u PED OU, DFE, OCT.  There are no Patient Instructions on file for this visit.   Explained the diagnoses, plan, and follow up with the patient and they expressed understanding.  Patient expressed understanding of the importance of proper follow up care.   This document serves as a record of services personally performed by Gardiner Sleeper, MD, PhD. It was created on their behalf by Roselee Nova, COMT. The creation  of this record is the provider's dictation and/or activities during the visit.  Electronically signed by: Roselee Nova, COMT 09/07/22 12:45 PM  This document serves as a record of services personally performed by Gardiner Sleeper, MD, PhD. It was created on their behalf by San Jetty. Owens Shark, OA an ophthalmic technician. The creation of this record is the provider's dictation and/or activities during the  visit.    Electronically signed by: San Jetty. Owens Shark, [place] 09.11.2023 12:45 PM  Gardiner Sleeper, M.D., Ph.D. Diseases & Surgery of the Retina and Vitreous [facility]  I have reviewed the above documentation for accuracy and completeness, and I agree with the above. Gardiner Sleeper, M.D., Ph.D. 09/07/22 12:45 PM   Abbreviations: M myopia (nearsighted); A astigmatism; H hyperopia (farsighted); P presbyopia; Mrx spectacle prescription;  CTL contact lenses; OD right eye; OS left eye; OU both eyes  XT exotropia; ET esotropia; PEK punctate epithelial keratitis; PEE punctate epithelial erosions; DES dry eye syndrome; MGD meibomian gland dysfunction; ATs artificial tears; PFAT's preservative free artificial tears; [place] nuclear sclerotic cataract; PSC posterior subcapsular cataract; ERM epi-retinal membrane; PVD posterior vitreous detachment; RD retinal detachment; DM diabetes mellitus; DR diabetic retinopathy; NPDR non-proliferative diabetic retinopathy; PDR proliferative diabetic retinopathy; CSME clinically significant macular edema; DME diabetic macular edema; dbh dot blot hemorrhages; CWS cotton wool spot; POAG primary open angle glaucoma; C/D cup-to-disc ratio; HVF humphrey visual field; GVF goldmann visual field; OCT optical coherence tomography; IOP intraocular pressure; BRVO Branch retinal vein occlusion; CRVO central retinal vein occlusion; CRAO central retinal artery occlusion; BRAO branch retinal artery occlusion; RT retinal tear; SB scleral buckle; PPV pars plana vitrectomy; VH Vitreous hemorrhage; PRP panretinal laser photocoagulation; IVK intravitreal kenalog; VMT vitreomacular traction; MH Macular hole;  NVD neovascularization of the disc; NVE neovascularization elsewhere; AREDS age related eye disease study; ARMD age related macular degeneration; POAG primary open angle glaucoma; EBMD epithelial/anterior basement membrane dystrophy; ACIOL anterior chamber intraocular lens; IOL  intraocular lens; PCIOL posterior chamber intraocular lens; Phaco/IOL phacoemulsification with intraocular lens placement; [place] photorefractive keratectomy; LASIK laser assisted in situ keratomileusis; HTN hypertension; DM diabetes mellitus; COPD chronic obstructive pulmonary disease

## 2022-09-07 ENCOUNTER — Ambulatory Visit (INDEPENDENT_AMBULATORY_CARE_PROVIDER_SITE_OTHER): Payer: Medicare HMO | Admitting: Ophthalmology

## 2022-09-07 ENCOUNTER — Encounter (INDEPENDENT_AMBULATORY_CARE_PROVIDER_SITE_OTHER): Payer: Self-pay | Admitting: Ophthalmology

## 2022-09-07 DIAGNOSIS — H35033 Hypertensive retinopathy, bilateral: Secondary | ICD-10-CM

## 2022-09-07 DIAGNOSIS — H35723 Serous detachment of retinal pigment epithelium, bilateral: Secondary | ICD-10-CM | POA: Diagnosis not present

## 2022-09-07 DIAGNOSIS — H353211 Exudative age-related macular degeneration, right eye, with active choroidal neovascularization: Secondary | ICD-10-CM | POA: Diagnosis not present

## 2022-09-07 DIAGNOSIS — Z961 Presence of intraocular lens: Secondary | ICD-10-CM

## 2022-09-07 DIAGNOSIS — I10 Essential (primary) hypertension: Secondary | ICD-10-CM

## 2022-09-10 NOTE — Progress Notes (Signed)
[facility] Note  09/11/2022     CHIEF COMPLAINT Patient presents for Retina Follow Up   HISTORY OF PRESENT ILLNESS: Marissa Mullen is a 81 y.o. female who presents to the clinic today for:   HPI     Retina Follow Up   Patient presents with  Wet AMD.  In right eye.  Severity is moderate.  Duration of 4 days.  Since onset it is stable.  I, the attending physician,  performed the HPI with the patient and updated documentation appropriately.        Comments   Pt here for 4 day ret f/u exu ARMD OD. Pt states [facility] the same, here for IVA OD.       Last edited by Bernarda Caffey, MD on 09/11/2022 11:31 AM.      Referring physician: Maryland Pink, MD [place],  [place] [id number]  HISTORICAL INFORMATION:   Selected notes from the MEDICAL RECORD NUMBER Referred by Dr. Ellin Mayhew for concern of operculated hole LEE:  Ocular Hx- PMH-    CURRENT MEDICATIONS: No current outpatient medications on file. (Ophthalmic Drugs)   No current facility-administered medications for this visit. (Ophthalmic Drugs)   Current Outpatient Medications (Other)  Medication Sig   acetaminophen (TYLENOL) 650 MG CR tablet Take 650-1,300 mg by mouth every 8 (eight) hours as needed for pain.    Calcium Carb-Cholecalciferol 600-800 MG-UNIT TABS Take 2 tablets by mouth daily.   Multiple Vitamins-Minerals (WOMENS MULTIVITAMIN PLUS) TABS Take 1 tablet by mouth daily.   pantoprazole (PROTONIX) 40 MG tablet Take 40 mg by mouth daily.   simvastatin (ZOCOR) 20 MG tablet Take 20 mg by mouth daily.   No current facility-administered medications for this visit. (Other)   REVIEW OF SYSTEMS: ROS   Positive for: Cardiovascular, Eyes Negative for: Constitutional, Gastrointestinal, Neurological, Skin, Genitourinary, Musculoskeletal, HENT, Endocrine, Respiratory, Psychiatric, Allergic/Imm, Heme/Lymph Last edited by Kingsley Spittle, COT on 09/11/2022  9:42  AM.     ALLERGIES Allergies  Allergen Reactions   Ondansetron     Other reaction(s): Abdominal Pain   Oxycodone Itching   Tramadol Itching    Unknown    PAST MEDICAL HISTORY Past Medical History:  Diagnosis Date   Arthritis    Colon polyps    Diverticulosis    Esophageal stricture    GERD (gastroesophageal reflux disease)    Hepatitis    "Age 23"   Hypercholesterolemia    Hypertension    Past Surgical History:  Procedure Laterality Date   ABDOMINAL HYSTERECTOMY     CATARACT EXTRACTION     COLONOSCOPY WITH PROPOFOL N/A 03/16/2020   Procedure: COLONOSCOPY WITH PROPOFOL;  Surgeon: Toledo, Benay Pike, MD;  Location: [facility];  Service: Gastroenterology;  Laterality: N/A;   COLONOSCOPY WITH PROPOFOL N/A 09/26/2021   Procedure: COLONOSCOPY WITH PROPOFOL;  Surgeon: Lesly Rubenstein, MD;  Location: [facility] END[facility];  Service: Endoscopy;  Laterality: N/A;   PARTIAL KNEE ARTHROPLASTY Left 01/23/2016   Procedure: UNICOMPARTMENTAL KNEE;  Surgeon: Corky Mull, MD;  Location: [facility] ORS;  Service: Orthopedics;  Laterality: Left;   FAMILY HISTORY Family History  Problem Relation Age of Onset   Diabetes Mother    Diabetes Daughter    Breast cancer Daughter 57   SOCIAL HISTORY Social History   Tobacco Use   Smoking status: Former    Packs/day: 0.50    Types: Cigarettes   Smokeless tobacco: Never  Vaping Use  Vaping Use: Never used  Substance Use Topics   Alcohol use: No   Drug use: No       OPHTHALMIC EXAM:  Base Eye Exam     Visual Acuity (Snellen - Linear)       Right Left   Dist [place] 20/25 -2 20/20 -1   Dist ph [place] 20/25          Tonometry (Tonopen, 9:47 AM)       Right Left   Pressure 13 10         Pupils       Pupils Dark Light Shape React APD   Right PERRL 3 2 Round Brisk None   Left PERRL 3 2 Round Brisk None         Visual Fields (Counting fingers)       Left Right    Full Full         Extraocular Movement       Right Left     Full, Ortho Full, Ortho         Neuro/Psych     Oriented x3: Yes   Mood/Affect: Normal         Dilation     Both eyes: 1.0% Mydriacyl, 2.5% Phenylephrine @ 9:48 AM           Slit Lamp and Fundus Exam     Slit Lamp Exam       Right Left   Lids/Lashes Dermatochalasis - upper lid Dermatochalasis - upper lid   Conjunctiva/Sclera White and quiet White and quiet   Cornea tear film debris, well healed cataract wound tear film debris, well healed cataract wound   Anterior Chamber Deep and quiet Deep and quiet   Iris Round and dilated Round and dilated   Lens PC IOL in good position PC IOL in good position   Anterior Vitreous Vitreous syneresis Vitreous syneresis         Fundus Exam       Right Left   Disc Pink and Sharp, Compact Pink and Sharp, Compact   C/D Ratio 0.1 0.1   Macula Flat, Good foveal reflex, RPE mottling, No heme or edema Flat, Good foveal reflex, RPE mottling, No heme or edema   Vessels attenuated, Tortuous attenuated, Tortuous   Periphery Attached, focal PED w/ SRH at 1200 -- mild interval increase in SRH; no RT/RD Attached, mild reticular degeneration, 2 focal SRH (one bright red, one darker) at 0130 midzone, no other heme; no RT/RD           IMAGING AND PROCEDURES  Imaging and Procedures for 09/11/2022  OCT, Retina - OU - Both Eyes       Right Eye Quality was good. Central Foveal Thickness: 275. Progression has been stable. Findings include normal foveal contour, no IRF, no SRF, vitreomacular adhesion (PED superior periphery -- caught on high density widefield -- interval increase in PED size and interval development of SRF on nasal border).   Left Eye Quality was good. Central Foveal Thickness: 302. Progression has been stable. Findings include normal foveal contour, no IRF, no SRF, pigment epithelial detachment, vitreomacular adhesion (Small, focal PED IN to fovea, 2 focal PEDs ST periphery caught on high density widefield, -- focal pocket  of SRF overlying more nasal PED, no IRF -- stable).   Notes *Images captured and stored on drive  Diagnosis / Impression:  NFP; no IRF/SRF centrally OU OD: PED superior periphery -- caught on high density widefield -- interval increase   in PED size and interval development of SRF on nasal border OS: Small, focal PED IN to fovea, 2 focal PEDs ST periphery caught on high density widefield, -- focal pocket of SRF overlying more nasal PED, no IRF -- stable  Clinical management:  See below  Abbreviations: NFP - Normal foveal profile. CME - cystoid macular edema. PED - pigment epithelial detachment. IRF - intraretinal fluid. SRF - subretinal fluid. EZ - ellipsoid zone. ERM - epiretinal membrane. ORA - outer retinal atrophy. ORT - outer retinal tubulation. SRHM - subretinal hyper-reflective material. IRHM - intraretinal hyper-reflective material      Intravitreal Injection, Pharmacologic Agent - OD - Right Eye       Time Out 09/11/2022. 10:22 AM. Confirmed correct patient, procedure, site, and patient consented.   Anesthesia Topical anesthesia was used. Anesthetic medications included Lidocaine 2%, Proparacaine 0.5%.   Procedure Preparation included 5% betadine to ocular surface, eyelid speculum. A (32g) needle was used.   Injection: 1.25 mg Bevacizumab 1.25mg/0.05ml   Route: Intravitreal, Site: Right Eye   NDC: 50242-060-01, Lot: [id number], Expiration date: 10/20/2022   Post-op Post injection exam found visual acuity of at least counting fingers. The patient tolerated the procedure well. There were no complications. The patient received written and verbal post procedure care education.            ASSESSMENT/PLAN:    ICD-10-CM   1. Exudative age-related macular degeneration of right eye with active choroidal neovascularization (HCC)  H35.3211 OCT, Retina - OU - Both Eyes    Intravitreal Injection, Pharmacologic Agent - OD - Right Eye    Bevacizumab (AVASTIN) SOLN 1.25 mg    2.  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 Essential hypertension  I10     4. Hypertensive retinopathy of both eyes  H35.033     5. Pseudophakia, both eyes  Z96.1      1.  Exudative age related macular degeneration, OD  - interval increase in peripheral PED size and development of focal SRF superior periphery on 09.11.23 visit  - BCVA  20/25  - The incidence pathology and anatomy of wet AMD discussed   - discussed treatment options including observation vs intravitreal anti-VEGF agents such as Avastin, Lucentis, Eylea.    - Risks of endophthalmitis and vascular occlusive events and atrophic changes discussed with patient  - OCT shows PED superior periphery -- caught on high density widefield -- interval increase in PED size and interval development of  SRF on nasal border  - recommend IVA OD #1 - pt wishes to be treated with IVA - RBA of procedure discussed, questions answered - informed consent obtained and signed - see procedure note   - f/u in 4 wks -- DFE/OCT, possible injection  2. PED OU  - exam shows focal PEDs w/ +subretinal heme OU  - OD 1200 equator  - OS 0130 midzone  - OCT shows OD: PED superior periphery -- caught on high density widefield -- interval increase in PED size and interval development of  SRF on nasal borderr; OS: Small, focal PED IN to fovea, 2 focal PEDs ST periphery caught on high density widefield, -- focal pocket of SRF overlying more nasal PED, no IRF -- stable  - BCVA 20/20 OD, 20/20 OS -- asymptomatic  - no retinal tear or retinal detachment  - discussed findings, prognosis  - unclear etiology of focal PEDs -- differential includes ARMD variant, PEHCR, PCV  - recommend IVA   OD #1 today, 09.11.23 as above  - f/u 4 days, DFE, OCT -- high density OCT (1200 OD, 0130 OS)  3,4. Hypertensive retinopathy OU - discussed importance of tight BP control - monitor  5. Pseudophakia OU  - s/p CE/IOL OU  - IOLs in good position  -  monitor  Ophthalmic Meds Ordered this visit:  Meds ordered this encounter  Medications   Bevacizumab (AVASTIN) SOLN 1.25 mg     Return in about 4 weeks (around 10/09/2022) for f/u Ex. AMD OD , DFE, OCT, Possible, IVA, OD.  There are no Patient Instructions on file for this visit.   Explained the diagnoses, plan, and follow up with the patient and they expressed understanding.  Patient expressed understanding of the importance of proper follow up care.   This document serves as a record of services personally performed by Brian G. Zamora, MD, PhD. It was created on their behalf by Amanda J. Brown, OA an ophthalmic technician. The creation of this record is the provider's dictation and/or activities during the visit.    Electronically signed by: Amanda J. Brown, OA 09.14.2023 11:31 AM  Brian G. Zamora, M.D., Ph.D. Diseases & Surgery of the Retina and Vitreous [facility]  I have reviewed the above documentation for accuracy and completeness, and I agree with the above. Brian G. Zamora, M.D., Ph.D. 09/11/22 11:33 AM  Abbreviations: M myopia (nearsighted); A astigmatism; H hyperopia (farsighted); P presbyopia; Mrx spectacle prescription;  CTL contact lenses; OD right eye; OS left eye; OU both eyes  XT exotropia; ET esotropia; PEK punctate epithelial keratitis; PEE punctate epithelial erosions; DES dry eye syndrome; MGD meibomian gland dysfunction; ATs artificial tears; PFAT's preservative free artificial tears; NSC nuclear sclerotic cataract; PSC posterior subcapsular cataract; ERM epi-retinal membrane; PVD posterior vitreous detachment; RD retinal detachment; DM diabetes mellitus; DR diabetic retinopathy; NPDR non-proliferative diabetic retinopathy; PDR proliferative diabetic retinopathy; CSME clinically significant macular edema; DME diabetic macular edema; dbh dot blot hemorrhages; CWS cotton wool spot; POAG primary open angle glaucoma; C/D cup-to-disc ratio; HVF  humphrey visual field; GVF goldmann visual field; OCT optical coherence tomography; IOP intraocular pressure; BRVO Branch retinal vein occlusion; CRVO central retinal vein occlusion; CRAO central retinal artery occlusion; BRAO branch retinal artery occlusion; RT retinal tear; SB scleral buckle; PPV pars plana vitrectomy; VH Vitreous hemorrhage; PRP panretinal laser photocoagulation; IVK intravitreal kenalog; VMT vitreomacular traction; MH Macular hole;  NVD neovascularization of the disc; NVE neovascularization elsewhere; AREDS age related eye disease study; ARMD age related macular degeneration; POAG primary open angle glaucoma; EBMD epithelial/anterior basement membrane dystrophy; ACIOL anterior chamber intraocular lens; IOL intraocular lens; PCIOL posterior chamber intraocular lens; Phaco/IOL phacoemulsification with intraocular lens placement; PRK photorefractive keratectomy; LASIK laser assisted in situ keratomileusis; HTN hypertension; DM diabetes mellitus; COPD chronic obstructive pulmonary disease 

## 2022-09-11 ENCOUNTER — Ambulatory Visit (INDEPENDENT_AMBULATORY_CARE_PROVIDER_SITE_OTHER): Payer: Medicare HMO | Admitting: Ophthalmology

## 2022-09-11 ENCOUNTER — Encounter (INDEPENDENT_AMBULATORY_CARE_PROVIDER_SITE_OTHER): Payer: Self-pay | Admitting: Ophthalmology

## 2022-09-11 DIAGNOSIS — H35723 Serous detachment of retinal pigment epithelium, bilateral: Secondary | ICD-10-CM

## 2022-09-11 DIAGNOSIS — H353211 Exudative age-related macular degeneration, right eye, with active choroidal neovascularization: Secondary | ICD-10-CM

## 2022-09-11 DIAGNOSIS — Z961 Presence of intraocular lens: Secondary | ICD-10-CM

## 2022-09-11 DIAGNOSIS — H35033 Hypertensive retinopathy, bilateral: Secondary | ICD-10-CM

## 2022-09-11 DIAGNOSIS — I10 Essential (primary) hypertension: Secondary | ICD-10-CM | POA: Diagnosis not present

## 2022-09-11 MED ORDER — BEVACIZUMAB CHEMO INJECTION 1.25MG/0.05ML SYRINGE FOR KALEIDOSCOPE
1.2500 mg | INTRAVITREAL | Status: AC | PRN
  Administered 2022-09-11: 1.25 mg via INTRAVITREAL

## 2022-09-28 ENCOUNTER — Other Ambulatory Visit: Payer: Self-pay | Admitting: Family Medicine

## 2022-09-28 DIAGNOSIS — Z1231 Encounter for screening mammogram for malignant neoplasm of breast: Secondary | ICD-10-CM

## 2022-10-08 NOTE — Progress Notes (Signed)
[facility] Note  10/12/2022     CHIEF COMPLAINT Patient presents for Retina Follow Up   HISTORY OF PRESENT ILLNESS: Marissa Mullen is a 81 y.o. female who presents to the clinic today for:   HPI     Retina Follow Up   Patient presents with  Wet AMD.  In right eye.  Severity is moderate.  Duration of 4 weeks.  Since onset it is stable.  I, the attending physician,  performed the HPI with the patient and updated documentation appropriately.        Comments   Pt here for 4 wk ret f/u exu ARMD OD. Pt states VA is about the same. No issues s/p 1st injection previous visit OD.       Last edited by Bernarda Caffey, MD on 10/12/2022  9:51 AM.      Referring physician: Maryland Pink, MD [address] [place],  [place] [address]  HISTORICAL INFORMATION:   Selected notes from the MEDICAL RECORD NUMBER Referred by Dr. Ellin Mayhew for concern of operculated hole LEE:  Ocular Hx- PMH-    CURRENT MEDICATIONS: No current outpatient medications on file. (Ophthalmic Drugs)   No current facility-administered medications for this visit. (Ophthalmic Drugs)   Current Outpatient Medications (Other)  Medication Sig   acetaminophen (TYLENOL) 650 MG CR tablet Take 650-1,300 mg by mouth every 8 (eight) hours as needed for pain.    Calcium Carb-Cholecalciferol 600-800 MG-UNIT TABS Take 2 tablets by mouth daily.   Multiple Vitamins-Minerals (WOMENS MULTIVITAMIN PLUS) TABS Take 1 tablet by mouth daily.   pantoprazole (PROTONIX) 40 MG tablet Take 40 mg by mouth daily.   simvastatin (ZOCOR) 20 MG tablet Take 20 mg by mouth daily.   No current facility-administered medications for this visit. (Other)   REVIEW OF SYSTEMS: ROS   Positive for: Cardiovascular, Eyes Negative for: Constitutional, Gastrointestinal, Neurological, Skin, Genitourinary, Musculoskeletal, HENT, Endocrine, Respiratory, Psychiatric, Allergic/Imm, Heme/Lymph Last edited by  Kingsley Spittle, COT on 10/12/2022  9:04 AM.     ALLERGIES Allergies  Allergen Reactions   Ondansetron     Other reaction(s): Abdominal Pain   Oxycodone Itching   Tramadol Itching    Unknown    PAST MEDICAL HISTORY Past Medical History:  Diagnosis Date   Arthritis    Colon polyps    Diverticulosis    Esophageal stricture    GERD (gastroesophageal reflux disease)    Hepatitis    "Age 67"   Hypercholesterolemia    Hypertension    Past Surgical History:  Procedure Laterality Date   ABDOMINAL HYSTERECTOMY     CATARACT EXTRACTION     COLONOSCOPY WITH PROPOFOL N/A 03/16/2020   Procedure: COLONOSCOPY WITH PROPOFOL;  Surgeon: Toledo, Benay Pike, MD;  Location: [facility] END[facility]COPY;  Service: Gastroenterology;  Laterality: N/A;   COLONOSCOPY WITH PROPOFOL N/A 09/26/2021   Procedure: COLONOSCOPY WITH PROPOFOL;  Surgeon: Lesly Rubenstein, MD;  Location: [facility] ENDOSCOPY;  Service: Endoscopy;  Laterality: N/A;   PARTIAL KNEE ARTHROPLASTY Left 01/23/2016   Procedure: UNICOMPARTMENTAL KNEE;  Surgeon: Corky Mull, MD;  Location: [facility] ORS;  Service: Orthopedics;  Laterality: Left;   FAMILY HISTORY Family History  Problem Relation Age of Onset   Diabetes Mother    Diabetes Daughter    Breast cancer Daughter 42   SOCIAL HISTORY Social History   Tobacco Use   Smoking status: Former    Packs/day: 0.50    Types: Cigarettes  Smokeless tobacco: Never  Vaping Use   Vaping Use: Never used  Substance Use Topics   Alcohol use: No   Drug use: No       OPHTHALMIC EXAM:  Base Eye Exam     Visual Acuity (Snellen - Linear)       Right Left   Dist [place] 20/25 -2 20/20   Dist ph [place] 20/20 -1          Tonometry (Tonopen, 9:11 AM)       Right Left   Pressure 12 9         Pupils       Pupils Dark Light Shape React APD   Right PERRL 3 2 Round Brisk None   Left PERRL 3 2 Round Brisk None         Visual Fields (Counting fingers)       Left Right    Full Full          Extraocular Movement       Right Left    Full, Ortho Full, Ortho         Neuro/Psych     Oriented x3: Yes   Mood/Affect: Normal         Dilation     Both eyes: 1.0% Mydriacyl, 2.5% Phenylephrine @ 9:11 AM           Slit Lamp and Fundus Exam     Slit Lamp Exam       Right Left   Lids/Lashes Dermatochalasis - upper lid Dermatochalasis - upper lid   Conjunctiva/Sclera White and quiet White and quiet   Cornea tear film debris, well healed cataract wound tear film debris, well healed cataract wound   Anterior Chamber Deep and quiet Deep and quiet   Iris Round and dilated Round and dilated   Lens PC IOL in good position PC IOL in good position   Anterior Vitreous Vitreous syneresis Vitreous syneresis         Fundus Exam       Right Left   Disc Pink and Sharp, Compact Pink and Sharp, Compact   C/D Ratio 0.1 0.1   Macula Flat, Good foveal reflex, RPE mottling, No heme or edema Flat, Good foveal reflex, RPE mottling, No heme or edema   Vessels attenuated, Tortuous attenuated, Tortuous   Periphery Attached, focal PED w/ SRH at 1200 -- persistent SRH; no RT/RD Attached, mild reticular degeneration, 2 focal SRH (one bright red, one darker) at 0130 midzone -- both improving, no other heme; no RT/RD           IMAGING AND PROCEDURES  Imaging and Procedures for 10/12/2022  OCT, Retina - OU - Both Eyes       Right Eye Quality was good. Central Foveal Thickness: 276. Progression has improved. Findings include normal foveal contour, no IRF, no SRF, vitreomacular adhesion (PED superior periphery -- caught on high density widefield -- stable PED size and interval improvement in SRF on nasal border).   Left Eye Quality was good. Central Foveal Thickness: 302. Progression has improved. Findings include normal foveal contour, no IRF, no SRF, pigment epithelial detachment, vitreomacular adhesion (Small, focal PED IN to fovea, 2 focal PEDs ST periphery caught on high  density widefield -- small focal pocket of SRF converted to [facility] overlying more nasal PED, no IRF -- stable).   Notes *Images captured and stored on drive  Diagnosis / Impression:  NFP; no IRF/SRF centrally OU OD: PED superior periphery --  caught on high density widefield -- stable PED size and interval improvement in  SRF on nasal border OS: Small, focal PED IN to fovea, 2 focal PEDs ST periphery caught on high density widefield -- small focal pocket of SRF converted to [facility] overlying more nasal PED, no IRF -- stable  Clinical management:  See below  Abbreviations: NFP - Normal foveal profile. CME - cystoid macular edema. PED - pigment epithelial detachment. IRF - intraretinal fluid. SRF - subretinal fluid. EZ - ellipsoid zone. ERM - epiretinal membrane. ORA - outer retinal atrophy. ORT - outer retinal tubulation. SRHM - subretinal hyper-reflective material. IRHM - intraretinal hyper-reflective material      Intravitreal Injection, Pharmacologic Agent - OD - Right Eye       Time Out 10/12/2022. 9:40 AM. Confirmed correct patient, procedure, site, and patient consented.   Anesthesia Topical anesthesia was used. Anesthetic medications included Lidocaine 2%, Proparacaine 0.5%.   Procedure Preparation included 5% betadine to ocular surface, eyelid speculum. A (32g) needle was used.   Injection: 1.25 mg Bevacizumab 1.41m/0.05ml   Route: Intravitreal, Site: Right Eye   NDC:: 83382-505-39 Lot: [id number] , Expiration date: 10/28/2022   Post-op Post injection exam found visual acuity of at least counting fingers. The patient tolerated the procedure well. There were no complications. The patient received written and verbal post procedure care education.            ASSESSMENT/PLAN:    ICD-10-CM   1. Exudative age-related macular degeneration of right eye with active choroidal neovascularization (HCC)  H35.3211 OCT, Retina - OU - Both Eyes    Intravitreal Injection, Pharmacologic  Agent - OD - Right Eye    Bevacizumab (AVASTIN) SOLN 1.25 mg    2. 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 Essential hypertension  I10     4. Hypertensive retinopathy of both eyes  H35.033     5. Pseudophakia, both eyes  Z96.1       1.  Exudative age related macular degeneration, OD  - s/p IVA OD #1 (09.15.23)  - interval increase in peripheral PED size and development of focal SRF superior periphery on 09.11.23 visit  - BCVA  20/20 -- improved  - OCT shows OD: PED superior periphery -- caught on high density widefield -- stable PED size and interval improvement in SRF on nasal border  - recommend IVA OD #2 today, 10.16.23 - pt wishes to be treated with IVA - RBA of procedure discussed, questions answered - informed consent obtained and signed - see procedure note   - f/u in 4 wks -- DFE/OCT, possible injection  2. PED OU  - exam shows focal PEDs w/ +subretinal heme OU  - OD 1200 equator  - OS 0130 midzone  - OCT shows OD: PED superior periphery -- caught on high density widefield -- stable PED size and interval improvement in  SRF on nasal border; OS: Small, focal PED IN to fovea, 2 focal PEDs ST periphery caught on high density widefield -- small focal pocket of SRF [facility] nasal PED, no IRF -- stable  - BCVA 20/20 OD, 20/20 OS -- asymptomatic  - no retinal tear or retinal detachment  - discussed findings, prognosis  - unclear etiology of focal PEDs -- differential includes ARMD variant, PEHCR, PCV  - recommend IVA OD #2 today, 10.16.23 as above  - f/u 4 weeks DFE, high density OCT (OD 1200, OS 0130)  3,4. Hypertensive  retinopathy OU - discussed importance of tight BP control - monitor  5. Pseudophakia OU  - s/p CE/IOL OU  - IOLs in good position  - monitor  Ophthalmic Meds Ordered this visit:  Meds ordered this encounter  Medications   Bevacizumab (AVASTIN) SOLN 1.25 mg     Return in about 4 weeks (around  11/09/2022) for ex ARMD, Dilated Exam, OCT, Possible Injxn.  There are no Patient Instructions on file for this visit.   Explained the diagnoses, plan, and follow up with the patient and they expressed understanding.  Patient expressed understanding of the importance of proper follow up care.   This document serves as a record of services personally performed by Gardiner Sleeper, MD, PhD. It was created on their behalf by San Jetty. Owens Shark, OA an ophthalmic technician. The creation of this record is the provider's dictation and/or activities during the visit.    Electronically signed by: San Jetty. Owens Shark, [place] 10.16.2023 12:51 PM  Gardiner Sleeper, M.D., Ph.D. Diseases & Surgery of the Retina and Vitreous [facility]  I have reviewed the above documentation for accuracy and completeness, and I agree with the above. Gardiner Sleeper, M.D., Ph.D. 10/12/22 12:53 PM   Abbreviations: M myopia (nearsighted); A astigmatism; H hyperopia (farsighted); P presbyopia; Mrx spectacle prescription;  CTL contact lenses; OD right eye; OS left eye; OU both eyes  XT exotropia; ET esotropia; PEK punctate epithelial keratitis; PEE punctate epithelial erosions; DES dry eye syndrome; MGD meibomian gland dysfunction; ATs artificial tears; PFAT's preservative free artificial tears; [place] nuclear sclerotic cataract; PSC posterior subcapsular cataract; ERM epi-retinal membrane; PVD posterior vitreous detachment; RD retinal detachment; DM diabetes mellitus; DR diabetic retinopathy; NPDR non-proliferative diabetic retinopathy; PDR proliferative diabetic retinopathy; CSME clinically significant macular edema; DME diabetic macular edema; dbh dot blot hemorrhages; CWS cotton wool spot; POAG primary open angle glaucoma; C/D cup-to-disc ratio; HVF humphrey visual field; GVF goldmann visual field; OCT optical coherence tomography; IOP intraocular pressure; BRVO Branch retinal vein occlusion; CRVO central retinal vein  occlusion; CRAO central retinal artery occlusion; BRAO branch retinal artery occlusion; RT retinal tear; SB scleral buckle; PPV pars plana vitrectomy; VH Vitreous hemorrhage; PRP panretinal laser photocoagulation; IVK intravitreal kenalog; VMT vitreomacular traction; MH Macular hole;  NVD neovascularization of the disc; NVE neovascularization elsewhere; AREDS age related eye disease study; ARMD age related macular degeneration; POAG primary open angle glaucoma; EBMD epithelial/anterior basement membrane dystrophy; ACIOL anterior chamber intraocular lens; IOL intraocular lens; PCIOL posterior chamber intraocular lens; Phaco/IOL phacoemulsification with intraocular lens placement; [place] photorefractive keratectomy; LASIK laser assisted in situ keratomileusis; HTN hypertension; DM diabetes mellitus; COPD chronic obstructive pulmonary disease

## 2022-10-12 ENCOUNTER — Encounter (INDEPENDENT_AMBULATORY_CARE_PROVIDER_SITE_OTHER): Payer: Self-pay | Admitting: Ophthalmology

## 2022-10-12 ENCOUNTER — Ambulatory Visit (INDEPENDENT_AMBULATORY_CARE_PROVIDER_SITE_OTHER): Payer: Medicare HMO | Admitting: Ophthalmology

## 2022-10-12 DIAGNOSIS — I10 Essential (primary) hypertension: Secondary | ICD-10-CM | POA: Diagnosis not present

## 2022-10-12 DIAGNOSIS — H35033 Hypertensive retinopathy, bilateral: Secondary | ICD-10-CM | POA: Diagnosis not present

## 2022-10-12 DIAGNOSIS — H35723 Serous detachment of retinal pigment epithelium, bilateral: Secondary | ICD-10-CM

## 2022-10-12 DIAGNOSIS — H353211 Exudative age-related macular degeneration, right eye, with active choroidal neovascularization: Secondary | ICD-10-CM | POA: Diagnosis not present

## 2022-10-12 DIAGNOSIS — Z961 Presence of intraocular lens: Secondary | ICD-10-CM

## 2022-10-12 MED ORDER — BEVACIZUMAB CHEMO INJECTION 1.25MG/0.05ML SYRINGE FOR KALEIDOSCOPE
1.2500 mg | INTRAVITREAL | Status: AC | PRN
  Administered 2022-10-12: 1.25 mg via INTRAVITREAL

## 2022-10-27 NOTE — Progress Notes (Signed)
[facility] Note  11/10/2022     CHIEF COMPLAINT Patient presents for Retina Follow Up   HISTORY OF PRESENT ILLNESS: Marissa Mullen is a 81 y.o. female who presents to the clinic today for:   HPI     Retina Follow Up   Patient presents with  Wet AMD.  In right eye.  This started 4.  Severity is moderate.  Duration of 4 weeks.  Since onset it is stable.  I, the attending physician,  performed the HPI with the patient and updated documentation appropriately.        Comments   4 week retina follow up EX AMD OD and IVA OD pt states no vision changes noticed she has seen a floater in the right eye that is without changes denies flashes of light       Last edited by Bernarda Caffey, MD on 11/10/2022 11:50 PM.    Pt is not having any problems with her vision right now   Referring physician: Maryland Pink, MD [address] [place],  [place] [id number]  HISTORICAL INFORMATION:   Selected notes from the MEDICAL RECORD NUMBER Referred by Dr. Ellin Mayhew for concern of operculated hole LEE:  Ocular Hx- PMH-    CURRENT MEDICATIONS: No current outpatient medications on file. (Ophthalmic Drugs)   No current facility-administered medications for this visit. (Ophthalmic Drugs)   Current Outpatient Medications (Other)  Medication Sig   acetaminophen (TYLENOL) 650 MG CR tablet Take 650-1,300 mg by mouth every 8 (eight) hours as needed for pain.    Calcium Carb-Cholecalciferol 600-800 MG-UNIT TABS Take 2 tablets by mouth daily.   Multiple Vitamins-Minerals (WOMENS MULTIVITAMIN PLUS) TABS Take 1 tablet by mouth daily.   pantoprazole (PROTONIX) 40 MG tablet Take 40 mg by mouth daily.   simvastatin (ZOCOR) 20 MG tablet Take 20 mg by mouth daily.   No current facility-administered medications for this visit. (Other)   REVIEW OF SYSTEMS:   ALLERGIES Allergies  Allergen Reactions   Ondansetron     Other reaction(s): Abdominal Pain    Oxycodone Itching   Tramadol Itching    Unknown    PAST MEDICAL HISTORY Past Medical History:  Diagnosis Date   Arthritis    Colon polyps    Diverticulosis    Esophageal stricture    GERD (gastroesophageal reflux disease)    Hepatitis    "Age 69"   Hypercholesterolemia    Hypertension    Past Surgical History:  Procedure Laterality Date   ABDOMINAL HYSTERECTOMY     CATARACT EXTRACTION     COLONOSCOPY WITH PROPOFOL N/A 03/16/2020   Procedure: COLONOSCOPY WITH PROPOFOL;  Surgeon: Toledo, Benay Pike, MD;  Location: [facility];  Service: Gastroenterology;  Laterality: N/A;   COLONOSCOPY WITH PROPOFOL N/A 09/26/2021   Procedure: COLONOSCOPY WITH PROPOFOL;  Surgeon: Lesly Rubenstein, MD;  Location: [facility] ENDOSCOPY;  Service: Endoscopy;  Laterality: N/A;   PARTIAL KNEE ARTHROPLASTY Left 01/23/2016   Procedure: UNICOMPARTMENTAL KNEE;  Surgeon: Corky Mull, MD;  Location: [facility] ORS;  Service: Orthopedics;  Laterality: Left;   FAMILY HISTORY Family History  Problem Relation Age of Onset   Diabetes Mother    Diabetes Daughter    Breast cancer Daughter 4   SOCIAL HISTORY Social History   Tobacco Use   Smoking status: Former    Packs/day: 0.50    Types: Cigarettes   Smokeless tobacco: Never  Vaping Use   Vaping Use: Never used  Substance Use Topics   Alcohol use: No   Drug use: No       OPHTHALMIC EXAM:  Base Eye Exam     Visual Acuity (Snellen - Linear)       Right Left   Dist [place] 20/30 -1 20/20 -1   Dist ph [place] 20/20 -2          Tonometry (Tonopen, 9:41 AM)       Right Left   Pressure 13 12         Pupils       Pupils Dark Light Shape React APD   Right PERRL 3 2 Round Brisk None   Left PERRL 3 2 Round Brisk None         Visual Fields       Left Right    Full Full         Extraocular Movement       Right Left    Full, Ortho Full, Ortho         Neuro/Psych     Oriented x3: Yes   Mood/Affect: Normal         Dilation      Both eyes: 2.5% Phenylephrine @ 9:41 AM           Slit Lamp and Fundus Exam     Slit Lamp Exam       Right Left   Lids/Lashes Dermatochalasis - upper lid Dermatochalasis - upper lid   Conjunctiva/Sclera White and quiet White and quiet   Cornea tear film debris, well healed cataract wound tear film debris, well healed cataract wound   Anterior Chamber Deep and quiet Deep and quiet   Iris Round and dilated Round and dilated   Lens PC IOL in good position PC IOL in good position   Anterior Vitreous Vitreous syneresis Vitreous syneresis         Fundus Exam       Right Left   Disc Pink and Sharp, Compact Pink and Sharp, Compact   C/D Ratio 0.1 0.1   Macula Flat, Good foveal reflex, RPE mottling, No heme or edema Flat, Good foveal reflex, RPE mottling, No heme or edema   Vessels attenuated, Tortuous attenuated, Tortuous   Periphery Attached, focal PED w/ SRH at 1200 -- persistent SRH, slightly increased; no RT/RD Attached, mild reticular degeneration, 2 focal SRH (temporal one larger) at 0130 midzone -- both improving and heme less red, no other heme; no RT/RD           IMAGING AND PROCEDURES  Imaging and Procedures for 11/10/2022  OCT, Retina - OU - Both Eyes       Right Eye Quality was good. Central Foveal Thickness: 275. Progression has been stable. Findings include normal foveal contour, no IRF, no SRF, vitreomacular adhesion (PED superior periphery -- caught on high density widefield -- stable PED size and persistent SRF on nasal border).   Left Eye Quality was good. Central Foveal Thickness: 304. Progression has improved. Findings include normal foveal contour, no IRF, no SRF, pigment epithelial detachment, vitreomacular adhesion (Small, focal PED IN to fovea, 2 focal PEDs ST periphery caught on high density widefield -- small focal pocket of SRF converted to [facility] overlying more nasal PED, no IRF -- improving).   Notes *Images captured and stored on  drive  Diagnosis / Impression:  NFP; no IRF/SRF centrally OU OD: PED superior periphery -- caught on high density widefield -- stable PED size and persistent SRF on  nasal border OS: Small, focal PED IN to fovea; 2 focal PEDs ST periphery caught on high density widefield -- small focal pocket of SRF converted to [facility] overlying more nasal PED, no IRF -- improving  Clinical management:  See below  Abbreviations: NFP - Normal foveal profile. CME - cystoid macular edema. PED - pigment epithelial detachment. IRF - intraretinal fluid. SRF - subretinal fluid. EZ - ellipsoid zone. ERM - epiretinal membrane. ORA - outer retinal atrophy. ORT - outer retinal tubulation. SRHM - subretinal hyper-reflective material. IRHM - intraretinal hyper-reflective material      Intravitreal Injection, Pharmacologic Agent - OD - Right Eye       Time Out 11/10/2022. 10:51 AM. Confirmed correct patient, procedure, site, and patient consented.   Anesthesia Topical anesthesia was used. Anesthetic medications included Lidocaine 2%, Proparacaine 0.5%.   Procedure Preparation included 5% betadine to ocular surface, eyelid speculum. A (32g) needle was used.   Injection: 1.25 mg Bevacizumab 1.'25mg'$ /0.76m   Route: Intravitreal, Site: Right Eye   NDC: [id number] Lot:: [id number] Expiration date: 11/25/2022   Post-op Post injection exam found visual acuity of at least counting fingers. The patient tolerated the procedure well. There were no complications. The patient received written and verbal post procedure care education.            ASSESSMENT/PLAN:    ICD-10-CM   1. Exudative age-related macular degeneration of right eye with active choroidal neovascularization (HCC)  H35.3211 OCT, Retina - OU - Both Eyes    Intravitreal Injection, Pharmacologic Agent - OD - Right Eye    Bevacizumab (AVASTIN) SOLN 1.25 mg    2. 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 Essential hypertension   I10     4. Hypertensive retinopathy of both eyes  H35.033     5. Pseudophakia, both eyes  Z96.1      1.  Exudative age related macular degeneration, OD  - s/p IVA OD #1 (09.15.23), #2 (10.16.23)  - interval increase in peripheral PED size and development of focal SRF superior periphery on 09.11.23 visit  - BCVA  20/20 -- stable  - OCT shows OD: PED superior periphery -- caught on high density widefield -- stable PED size and persistent SRF on nasal border  - recommend IVA OD #3 today, 11.14.23 - pt wishes to be treated with IVA - RBA of procedure discussed, questions answered - informed consent obtained and signed - see procedure note   - f/u in 4 wks -- DFE/OCT, possible injection  2. PED OU  - exam shows focal PEDs w/ +subretinal heme OU  - OD 1200 equator  - OS 0130 midzone  - OCT shows OD: PED superior periphery -- caught on high density widefield -- stable PED size and persistent SRF on nasal border; OS: Small, focal PED IN to fovea, 2 focal PEDs ST periphery caught on high density widefield -- small focal pocket of SRF converted to [facility] more nasal PED, no IRF -- improving  - BCVA 20/20 OD, 20/20 OS -- asymptomatic  - no retinal tear or retinal detachment  - discussed findings, prognosis  - unclear etiology of focal PEDs -- differential includes ARMD variant, PEHCR, PCV  - recommend IVA OD #3 today, 11.14.23 as above  - f/u 4 weeks DFE, high density OCT (OD 1200, OS 0130)  3,4. Hypertensive retinopathy OU - discussed importance of tight BP control - monitor  5. Pseudophakia OU  - s/p CE/IOL OU  -  IOLs in good position  - monitor  Ophthalmic Meds Ordered this visit:  Meds ordered this encounter  Medications   Bevacizumab (AVASTIN) SOLN 1.25 mg     Return in about 4 weeks (around 12/08/2022) for f/u exu ARMD OD, DFE, OCT.  There are no Patient Instructions on file for this visit.   Explained the diagnoses, plan, and follow up with the patient and they  expressed understanding.  Patient expressed understanding of the importance of proper follow up care.   This document serves as a record of services personally performed by Gardiner Sleeper, MD, PhD. It was created on their behalf by Renaldo Reel, [place] an ophthalmic technician. The creation of this record is the provider's dictation and/or activities during the visit.    Electronically signed by:  Renaldo Reel, COT  10.31.23 1:49 AM  This document serves as a record of services personally performed by Gardiner Sleeper, MD, PhD. It was created on their behalf by San Jetty. Owens Shark, OA an ophthalmic technician. The creation of this record is the provider's dictation and/or activities during the visit.    Electronically signed by: San Jetty. Owens Shark, OA 11. 1:49 AM  Gardiner Sleeper, M.D., Ph.D. Diseases & Surgery of the Retina and Vitreous [facility]  I have reviewed the above documentation for accuracy and completeness, and I agree with the above. Gardiner Sleeper, M.D., Ph.D. 11/13/22 1:52 AM   Abbreviations: M myopia (nearsighted); A astigmatism; H hyperopia (farsighted); P presbyopia; Mrx spectacle prescription;  CTL contact lenses; OD right eye; OS left eye; OU both eyes  XT exotropia; ET esotropia; PEK punctate epithelial keratitis; PEE punctate epithelial erosions; DES dry eye syndrome; MGD meibomian gland dysfunction; ATs artificial tears; PFAT's preservative free artificial tears; [place] nuclear sclerotic cataract; PSC posterior subcapsular cataract; ERM epi-retinal membrane; PVD posterior vitreous detachment; RD retinal detachment; DM diabetes mellitus; DR diabetic retinopathy; NPDR non-proliferative diabetic retinopathy; PDR proliferative diabetic retinopathy; CSME clinically significant macular edema; DME diabetic macular edema; dbh dot blot hemorrhages; CWS cotton wool spot; POAG primary open angle glaucoma; C/D cup-to-disc ratio; HVF humphrey visual field; GVF  goldmann visual field; OCT optical coherence tomography; IOP intraocular pressure; BRVO Branch retinal vein occlusion; CRVO central retinal vein occlusion; CRAO central retinal artery occlusion; BRAO branch retinal artery occlusion; RT retinal tear; SB scleral buckle; PPV pars plana vitrectomy; VH Vitreous hemorrhage; PRP panretinal laser photocoagulation; IVK intravitreal kenalog; VMT vitreomacular traction; MH Macular hole;  NVD neovascularization of the disc; NVE neovascularization elsewhere; AREDS age related eye disease study; ARMD age related macular degeneration; POAG primary open angle glaucoma; EBMD epithelial/anterior basement membrane dystrophy; ACIOL anterior chamber intraocular lens; IOL intraocular lens; PCIOL posterior chamber intraocular lens; Phaco/IOL phacoemulsification with intraocular lens placement; [place] photorefractive keratectomy; LASIK laser assisted in situ keratomileusis; HTN hypertension; DM diabetes mellitus; COPD chronic obstructive pulmonary disease

## 2022-10-30 ENCOUNTER — Ambulatory Visit
Admission: RE | Admit: 2022-10-30 | Discharge: 2022-10-30 | Disposition: A | Discharge status: Home / Self Care | Payer: Medicare HMO | Source: Ambulatory Visit | Attending: Family Medicine | Admitting: Family Medicine

## 2022-10-30 DIAGNOSIS — Z1231 Encounter for screening mammogram for malignant neoplasm of breast: Secondary | ICD-10-CM | POA: Diagnosis not present

## 2022-11-10 ENCOUNTER — Ambulatory Visit (INDEPENDENT_AMBULATORY_CARE_PROVIDER_SITE_OTHER): Payer: Medicare HMO | Admitting: Ophthalmology

## 2022-11-10 ENCOUNTER — Encounter (INDEPENDENT_AMBULATORY_CARE_PROVIDER_SITE_OTHER): Payer: Self-pay | Admitting: Ophthalmology

## 2022-11-10 DIAGNOSIS — H353211 Exudative age-related macular degeneration, right eye, with active choroidal neovascularization: Secondary | ICD-10-CM | POA: Diagnosis not present

## 2022-11-10 DIAGNOSIS — H35033 Hypertensive retinopathy, bilateral: Secondary | ICD-10-CM

## 2022-11-10 DIAGNOSIS — Z961 Presence of intraocular lens: Secondary | ICD-10-CM | POA: Diagnosis not present

## 2022-11-10 DIAGNOSIS — I10 Essential (primary) hypertension: Secondary | ICD-10-CM

## 2022-11-10 DIAGNOSIS — H35723 Serous detachment of retinal pigment epithelium, bilateral: Secondary | ICD-10-CM | POA: Diagnosis not present

## 2022-11-10 MED ORDER — BEVACIZUMAB CHEMO INJECTION 1.25MG/0.05ML SYRINGE FOR KALEIDOSCOPE
1.2500 mg | INTRAVITREAL | Status: AC | PRN
  Administered 2022-11-10: 1.25 mg via INTRAVITREAL

## 2022-11-11 DIAGNOSIS — R8290 Unspecified abnormal findings in urine: Secondary | ICD-10-CM | POA: Diagnosis not present

## 2022-11-24 NOTE — Progress Notes (Signed)
[facility] Note  12/08/2022     CHIEF COMPLAINT Patient presents for Retina Follow Up   HISTORY OF PRESENT ILLNESS: Marissa Mullen is a 81 y.o. female who presents to the clinic today for:   HPI     Retina Follow Up   Patient presents with  Wet AMD.  In right eye.  Severity is moderate.  Duration of 4 weeks.  Since onset it is stable.  I, the attending physician,  performed the HPI with the patient and updated documentation appropriately.        Comments   Pt here for 4 wk ret f/u exu ARMD OD. Pt states [facility] the same. No changes noted.       Last edited by Bernarda Caffey, MD on 12/08/2022  4:36 PM.     Referring physician: Maryland Pink, MD [address] [place],  [place] [id number]  HISTORICAL INFORMATION:   Selected notes from the MEDICAL RECORD NUMBER Referred by Dr. Ellin Mayhew for concern of operculated hole LEE:  Ocular Hx- PMH-    CURRENT MEDICATIONS: No current outpatient medications on file. (Ophthalmic Drugs)   No current facility-administered medications for this visit. (Ophthalmic Drugs)   Current Outpatient Medications (Other)  Medication Sig   acetaminophen (TYLENOL) 650 MG CR tablet Take 650-1,300 mg by mouth every 8 (eight) hours as needed for pain.    Calcium Carb-Cholecalciferol 600-800 MG-UNIT TABS Take 2 tablets by mouth daily.   Multiple Vitamins-Minerals (WOMENS MULTIVITAMIN PLUS) TABS Take 1 tablet by mouth daily.   pantoprazole (PROTONIX) 40 MG tablet Take 40 mg by mouth daily.   simvastatin (ZOCOR) 20 MG tablet Take 20 mg by mouth daily.   No current facility-administered medications for this visit. (Other)   REVIEW OF SYSTEMS: ROS   Positive for: Cardiovascular, Eyes Negative for: Constitutional, Gastrointestinal, Neurological, Skin, Genitourinary, Musculoskeletal, HENT, Endocrine, Respiratory, Psychiatric, Allergic/Imm, Heme/Lymph Last edited by Kingsley Spittle, COT on 12/08/2022   9:17 AM.     ALLERGIES Allergies  Allergen Reactions   Ondansetron     Other reaction(s): Abdominal Pain   Oxycodone Itching   Tramadol Itching    Unknown    PAST MEDICAL HISTORY Past Medical History:  Diagnosis Date   Arthritis    Colon polyps    Diverticulosis    Esophageal stricture    GERD (gastroesophageal reflux disease)    Hepatitis    "Age 29"   Hypercholesterolemia    Hypertension    Past Surgical History:  Procedure Laterality Date   ABDOMINAL HYSTERECTOMY     CATARACT EXTRACTION     COLONOSCOPY WITH PROPOFOL N/A 03/16/2020   Procedure: COLONOSCOPY WITH PROPOFOL;  Surgeon: Toledo, Benay Pike, MD;  Location: [facility];  Service: Gastroenterology;  Laterality: N/A;   COLONOSCOPY WITH PROPOFOL N/A 09/26/2021   Procedure: COLONOSCOPY WITH PROPOFOL;  Surgeon: Lesly Rubenstein, MD;  Location: [facility] ENDOSCOPY;  Service: Endoscopy;  Laterality: N/A;   PARTIAL KNEE ARTHROPLASTY Left 01/23/2016   Procedure: UNICOMPARTMENTAL KNEE;  Surgeon: Corky Mull, MD;  Location: [facility] ORS;  Service: Orthopedics;  Laterality: Left;   FAMILY HISTORY Family History  Problem Relation Age of Onset   Diabetes Mother    Diabetes Daughter    Breast cancer Daughter 18   SOCIAL HISTORY Social History   Tobacco Use   Smoking status: Former    Packs/day: 0.50    Types: Cigarettes   Smokeless tobacco: Never  Vaping Use  Vaping Use: Never used  Substance Use Topics   Alcohol use: No   Drug use: No       OPHTHALMIC EXAM:  Base Eye Exam     Visual Acuity (Snellen - Linear)       Right Left   Dist [place] 20/20 -2 20/15 -1         Tonometry (Tonopen, 9:21 AM)       Right Left   Pressure 10 9         Pupils       Pupils Dark Light Shape React APD   Right PERRL 3 2 Round Brisk None   Left PERRL 3 2 Round Brisk None         Visual Fields (Counting fingers)       Left Right    Full Full         Extraocular Movement       Right Left    Full, Ortho  Full, Ortho         Neuro/Psych     Oriented x3: Yes   Mood/Affect: Normal         Dilation     Both eyes: 1.0% Mydriacyl, 2.5% Phenylephrine @ 9:22 AM           Slit Lamp and Fundus Exam     Slit Lamp Exam       Right Left   Lids/Lashes Dermatochalasis - upper lid Dermatochalasis - upper lid   Conjunctiva/Sclera White and quiet White and quiet   Cornea tear film debris, well healed cataract wound tear film debris, well healed cataract wound   Anterior Chamber Deep and quiet Deep and quiet   Iris Round and dilated Round and dilated   Lens PC IOL in good position PC IOL in good position   Anterior Vitreous Vitreous syneresis Vitreous syneresis         Fundus Exam       Right Left   Disc Pink and Sharp, Compact Pink and Sharp, Compact   C/D Ratio 0.1 0.1   Macula Flat, Good foveal reflex, RPE mottling, No heme or edema Flat, Good foveal reflex, RPE mottling, No heme or edema   Vessels attenuated, Tortuous attenuated, Tortuous   Periphery Attached, focal PED w/ SRH at 1200 -- persistent SRH, slightly improved; no RT/RD Attached, mild reticular degeneration, 2 focal SRH (temporal one larger) at 0130 midzone -- both improving, temporal one developing pigment ring, heme less red, no other heme, no RT/RD           IMAGING AND PROCEDURES  Imaging and Procedures for 12/08/2022  OCT, Retina - OU - Both Eyes       Right Eye Quality was good. Central Foveal Thickness: 275. Progression has improved. Findings include normal foveal contour, no IRF, no SRF, vitreomacular adhesion (PED superior periphery -- caught on high density widefield -- stable PED size and mild interval improvement in SRF on nasal border).   Left Eye Quality was good. Central Foveal Thickness: 309. Progression has been stable. Findings include normal foveal contour, no IRF, no SRF, pigment epithelial detachment, vitreomacular adhesion (Small, focal PED IN to fovea, 2 focal PEDs ST periphery caught  on high density widefield -- small focal pocket of SRF converted to SRHM overlying more nasal PED, no IRF -- stable).   Notes *Images captured and stored on drive  Diagnosis / Impression:  NFP; no IRF/SRF centrally OU OD: PED superior periphery -- caught on high density widefield --   stable PED size and mild interval improvement in SRF on nasal border OS: Small, focal PED IN to fovea; 2 focal PEDs ST periphery caught on high density widefield -- small focal pocket of SRF converted to [facility] overlying more nasal PED, no IRF -- improving  Clinical management:  See below  Abbreviations: NFP - Normal foveal profile. CME - cystoid macular edema. PED - pigment epithelial detachment. IRF - intraretinal fluid. SRF - subretinal fluid. EZ - ellipsoid zone. ERM - epiretinal membrane. ORA - outer retinal atrophy. ORT - outer retinal tubulation. SRHM - subretinal hyper-reflective material. IRHM - intraretinal hyper-reflective material      Intravitreal Injection, Pharmacologic Agent - OD - Right Eye       Time Out 12/08/2022. 10:15 AM. Confirmed correct patient, procedure, site, and patient consented.   Anesthesia Topical anesthesia was used. Anesthetic medications included Lidocaine 2%, Proparacaine 0.5%.   Procedure Preparation included 5% betadine to ocular surface, eyelid speculum. A (32g) needle was used.   Injection: 1.25 mg Bevacizumab 1.27m/0.05ml   Route: Intravitreal, Site: Right Eye   NDC: 50242-060-01, Lot: [id number] , Expiration date: 01/04/2023   Post-op Post injection exam found visual acuity of at least counting fingers. The patient tolerated the procedure well. There were no complications. The patient received written and verbal post procedure care education.            ASSESSMENT/PLAN:    ICD-10-CM   1. Exudative age-related macular degeneration of right eye with active choroidal neovascularization (HCC)  H35.3211 OCT, Retina - OU - Both Eyes    Intravitreal Injection,  Pharmacologic Agent - OD - Right Eye    Bevacizumab (AVASTIN) SOLN 1.25 mg    2. 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 Essential hypertension  I10     4. Hypertensive retinopathy of both eyes  H35.033     5. Pseudophakia, both eyes  Z96.1       1.  Exudative age related macular degeneration, OD  - s/p IVA OD #1 (09.15.23), #2 (10.16.23), #3 (11.14.23)  - interval increase in peripheral PED size and development of focal SRF superior periphery on 09.11.23 visit  - BCVA  20/20 -- stable  - OCT shows OD: PED superior periphery -- caught on high density widefield -- stable PED size and interval improvement in SRF on nasal border at 4 weeks  - recommend IVA OD #4 today, 12.12.23 with extension to 5 weeks - pt wishes to be treated with IVA - RBA of procedure discussed, questions answered - informed consent obtained and signed - see procedure note   - f/u in 4 wks -- DFE/OCT, possible injection  2. PED OU  - exam shows focal PEDs w/ +subretinal heme OU  - OD 1200 equator  - OS 0130 midzone  - OCT shows OD: PED superior periphery -- caught on high density widefield -- stable PED size and interval improvement in SRF on nasal border; OS: Small, focal PED IN to fovea, 2 focal PEDs ST periphery caught on high density widefield -- small focal pocket of SRF converted to [facility] more nasal PED, no IRF -- stable  - BCVA 20/20 OD, 20/20 OS -- asymptomatic  - no retinal tear or retinal detachment  - discussed findings, prognosis  - unclear etiology of focal PEDs -- differential includes ARMD variant, PEHCR, PCV  - recommend IVA OD #4 today, 12.12.23 as above  - f/u 4 weeks DFE, high density OCT (OD 1200,  OS 0130)  3,4. Hypertensive retinopathy OU - discussed importance of tight BP control - monitor  5. Pseudophakia OU  - s/p CE/IOL OU  - IOLs in good position  - monitor  Ophthalmic Meds Ordered this visit:  Meds ordered this encounter  Medications    Bevacizumab (AVASTIN) SOLN 1.25 mg     Return in about 5 weeks (around 01/12/2023) for f/u exu ARMD OD, DFE, OCT.  There are no Patient Instructions on file for this visit.   Explained the diagnoses, plan, and follow up with the patient and they expressed understanding.  Patient expressed understanding of the importance of proper follow up care.   This document serves as a record of services personally performed by Gardiner Sleeper, MD, PhD. It was created on their behalf by Renaldo Reel, [place] an ophthalmic technician. The creation of this record is the provider's dictation and/or activities during the visit.    Electronically signed by:  Renaldo Reel, COT  11.28.23 1:34 AM  This document serves as a record of services personally performed by Gardiner Sleeper, MD, PhD. It was created on their behalf by San Jetty. Owens Shark, OA an ophthalmic technician. The creation of this record is the provider's dictation and/or activities during the visit.    Electronically signed by: San Jetty. Owens Shark, [place] 12.12.2023 1:34 AM   Gardiner Sleeper, M.D., Ph.D. Diseases & Surgery of the Retina and Vitreous [facility]  I have reviewed the above documentation for accuracy and completeness, and I agree with the above. Gardiner Sleeper, M.D., Ph.D. 12/11/22 1:35 AM  Abbreviations: M myopia (nearsighted); A astigmatism; H hyperopia (farsighted); P presbyopia; Mrx spectacle prescription;  CTL contact lenses; OD right eye; OS left eye; OU both eyes  XT exotropia; ET esotropia; PEK punctate epithelial keratitis; PEE punctate epithelial erosions; DES dry eye syndrome; MGD meibomian gland dysfunction; ATs artificial tears; PFAT's preservative free artificial tears; [place] nuclear sclerotic cataract; PSC posterior subcapsular cataract; ERM epi-retinal membrane; PVD posterior vitreous detachment; RD retinal detachment; DM diabetes mellitus; DR diabetic retinopathy; NPDR non-proliferative diabetic  retinopathy; PDR proliferative diabetic retinopathy; CSME clinically significant macular edema; DME diabetic macular edema; dbh dot blot hemorrhages; CWS cotton wool spot; POAG primary open angle glaucoma; C/D cup-to-disc ratio; HVF humphrey visual field; GVF goldmann visual field; OCT optical coherence tomography; IOP intraocular pressure; BRVO Branch retinal vein occlusion; CRVO central retinal vein occlusion; CRAO central retinal artery occlusion; BRAO branch retinal artery occlusion; RT retinal tear; SB scleral buckle; PPV pars plana vitrectomy; VH Vitreous hemorrhage; PRP panretinal laser photocoagulation; IVK intravitreal kenalog; VMT vitreomacular traction; MH Macular hole;  NVD neovascularization of the disc; NVE neovascularization elsewhere; AREDS age related eye disease study; ARMD age related macular degeneration; POAG primary open angle glaucoma; EBMD epithelial/anterior basement membrane dystrophy; ACIOL anterior chamber intraocular lens; IOL intraocular lens; PCIOL posterior chamber intraocular lens; Phaco/IOL phacoemulsification with intraocular lens placement; [place] photorefractive keratectomy; LASIK laser assisted in situ keratomileusis; HTN hypertension; DM diabetes mellitus; COPD chronic obstructive pulmonary disease

## 2022-12-08 ENCOUNTER — Encounter (INDEPENDENT_AMBULATORY_CARE_PROVIDER_SITE_OTHER): Payer: Self-pay | Admitting: Ophthalmology

## 2022-12-08 ENCOUNTER — Ambulatory Visit (INDEPENDENT_AMBULATORY_CARE_PROVIDER_SITE_OTHER): Payer: Medicare HMO | Admitting: Ophthalmology

## 2022-12-08 DIAGNOSIS — H35723 Serous detachment of retinal pigment epithelium, bilateral: Secondary | ICD-10-CM | POA: Diagnosis not present

## 2022-12-08 DIAGNOSIS — H353211 Exudative age-related macular degeneration, right eye, with active choroidal neovascularization: Secondary | ICD-10-CM

## 2022-12-08 DIAGNOSIS — H35033 Hypertensive retinopathy, bilateral: Secondary | ICD-10-CM

## 2022-12-08 DIAGNOSIS — I10 Essential (primary) hypertension: Secondary | ICD-10-CM

## 2022-12-08 DIAGNOSIS — Z961 Presence of intraocular lens: Secondary | ICD-10-CM

## 2022-12-08 MED ORDER — BEVACIZUMAB CHEMO INJECTION 1.25MG/0.05ML SYRINGE FOR KALEIDOSCOPE
1.2500 mg | INTRAVITREAL | Status: AC | PRN
  Administered 2022-12-08: 1.25 mg via INTRAVITREAL

## 2022-12-29 NOTE — Progress Notes (Signed)
[facility] Note  01/12/2023     CHIEF COMPLAINT Patient presents for Retina Follow Up   HISTORY OF PRESENT ILLNESS: Marissa Mullen is a 82 y.o. female who presents to the clinic today for:   HPI     Retina Follow Up   Patient presents with  Wet AMD.  In right eye.  Severity is moderate.  Duration of 5 weeks.  Since onset it is stable.  I, the attending physician,  performed the HPI with the patient and updated documentation appropriately.        Comments   Pt here for 5 wk ret f/u exu ARMD OS. Pt states [facility] the same, no changes.       Last edited by Bernarda Caffey, MD on 01/12/2023  9:42 PM.      Referring physician: Maryland Pink, MD [address],  [place] [address]  HISTORICAL INFORMATION:   Selected notes from the MEDICAL RECORD NUMBER Referred by Dr. Ellin Mayhew for concern of operculated hole LEE:  Ocular Hx- PMH-    CURRENT MEDICATIONS: No current outpatient medications on file. (Ophthalmic Drugs)   No current facility-administered medications for this visit. (Ophthalmic Drugs)   Current Outpatient Medications (Other)  Medication Sig   acetaminophen (TYLENOL) 650 MG CR tablet Take 650-1,300 mg by mouth every 8 (eight) hours as needed for pain.    Calcium Carb-Cholecalciferol 600-800 MG-UNIT TABS Take 2 tablets by mouth daily.   Multiple Vitamins-Minerals (WOMENS MULTIVITAMIN PLUS) TABS Take 1 tablet by mouth daily.   pantoprazole (PROTONIX) 40 MG tablet Take 40 mg by mouth daily.   simvastatin (ZOCOR) 20 MG tablet Take 20 mg by mouth daily.   No current facility-administered medications for this visit. (Other)   REVIEW OF SYSTEMS: ROS   Positive for: Cardiovascular, Eyes Negative for: Constitutional, Gastrointestinal, Neurological, Skin, Genitourinary, Musculoskeletal, HENT, Endocrine, Respiratory, Psychiatric, Allergic/Imm, Heme/Lymph Last edited by Kingsley Spittle, COT on 01/12/2023  9:12 AM.      ALLERGIES Allergies  Allergen Reactions   Ondansetron     Other reaction(s): Abdominal Pain   Oxycodone Itching   Tramadol Itching    Unknown    PAST MEDICAL HISTORY Past Medical History:  Diagnosis Date   Arthritis    Colon polyps    Diverticulosis    Esophageal stricture    GERD (gastroesophageal reflux disease)    Hepatitis    "Age 11"   Hypercholesterolemia    Hypertension    Past Surgical History:  Procedure Laterality Date   ABDOMINAL HYSTERECTOMY     CATARACT EXTRACTION     COLONOSCOPY WITH PROPOFOL N/A 03/16/2020   Procedure: COLONOSCOPY WITH PROPOFOL;  Surgeon: Toledo, Benay Pike, MD;  Location: [facility] END[facility];  Service: Gastroenterology;  Laterality: N/A;   COLONOSCOPY WITH PROPOFOL N/A 09/26/2021   Procedure: COLONOSCOPY WITH PROPOFOL;  Surgeon: Lesly Rubenstein, MD;  Location: [facility] ENDOSCOPY;  Service: Endoscopy;  Laterality: N/A;   PARTIAL KNEE ARTHROPLASTY Left 01/23/2016   Procedure: UNICOMPARTMENTAL KNEE;  Surgeon: Corky Mull, MD;  Location: [facility] ORS;  Service: Orthopedics;  Laterality: Left;   FAMILY HISTORY Family History  Problem Relation Age of Onset   Diabetes Mother    Diabetes Daughter    Breast cancer Daughter 49   SOCIAL HISTORY Social History   Tobacco Use   Smoking status: Former    Packs/day: 0.50    Types: Cigarettes   Smokeless tobacco: Never  Vaping Use  Vaping Use: Never used  Substance Use Topics   Alcohol use: No   Drug use: No       OPHTHALMIC EXAM:  Base Eye Exam     Visual Acuity (Snellen - Linear)       Right Left   Dist [place] 20/25 -1 20/15 -1   Dist ph [place] NI          Tonometry (Tonopen, 9:18 AM)       Right Left   Pressure 14 12         Pupils       Pupils Dark Light Shape React APD   Right PERRL 3 2 Round Brisk None   Left PERRL 3 2 Round Brisk None         Visual Fields (Counting fingers)       Left Right    Full Full         Extraocular Movement       Right Left     Full, Ortho Full, Ortho         Neuro/Psych     Oriented x3: Yes   Mood/Affect: Normal         Dilation     Both eyes: 1.0% Mydriacyl, 2.5% Phenylephrine @ 9:19 AM           Slit Lamp and Fundus Exam     Slit Lamp Exam       Right Left   Lids/Lashes Dermatochalasis - upper lid Dermatochalasis - upper lid   Conjunctiva/Sclera White and quiet White and quiet   Cornea tear film debris, well healed cataract wound tear film debris, well healed cataract wound   Anterior Chamber Deep and quiet Deep and quiet   Iris Round and dilated Round and dilated   Lens PC IOL in good position PC IOL in good position   Anterior Vitreous Vitreous syneresis Vitreous syneresis         Fundus Exam       Right Left   Disc Pink and Sharp, Compact Pink and Sharp, Compact   C/D Ratio 0.1 0.1   Macula Flat, Good foveal reflex, RPE mottling, No heme or edema Flat, Good foveal reflex, RPE mottling, No heme or edema   Vessels attenuated, Tortuous attenuated, Tortuous   Periphery Attached, focal PED w/ SRH at 1200 -- SRH turning white centrally, slightly improved; no RT/RD Attached, mild reticular degeneration, 2 focal SRH (temporal one larger) at 0130 midzone; nasal one almost resolved, temporal one with new fibrotic SRH on temporal side, no RT/RD           IMAGING AND PROCEDURES  Imaging and Procedures for 01/12/2023  OCT, Retina - OU - Both Eyes       Right Eye Quality was good. Central Foveal Thickness: 278. Progression has improved. Findings include normal foveal contour, no IRF, no SRF, vitreomacular adhesion (PED superior periphery -- caught on high density widefield -- stable PED size and stable improvement in SRF on nasal border).   Left Eye Quality was good. Central Foveal Thickness: 307. Progression has worsened. Findings include normal foveal contour, no IRF, no SRF, pigment epithelial detachment, vitreomacular adhesion (Small, focal PED IN to fovea, 2 focal PEDs ST periphery  caught on high density widefield -- small focal pocket of SRF converted to [facility] overlying more nasal PED, no IRF -- stable; new SRHM on temporal edge of temporal PED).   Notes *Images captured and stored on drive  Diagnosis / Impression:  NFP;  no IRF/SRF centrally OU OD: PED superior periphery -- caught on high density widefield -- stable PED size and stable improvement in SRF on nasal border OS: Small, focal PED IN to fovea, 2 focal PEDs ST periphery caught on high density widefield -- small focal pocket of SRF converted to [facility] overlying more nasal PED, no IRF -- stable; new SRHM on temporal edge of temporal PED  Clinical management:  See below  Abbreviations: NFP - Normal foveal profile. CME - cystoid macular edema. PED - pigment epithelial detachment. IRF - intraretinal fluid. SRF - subretinal fluid. EZ - ellipsoid zone. ERM - epiretinal membrane. ORA - outer retinal atrophy. ORT - outer retinal tubulation. SRHM - subretinal hyper-reflective material. IRHM - intraretinal hyper-reflective material      Intravitreal Injection, Pharmacologic Agent - OD - Right Eye       Time Out 01/12/2023. 10:14 AM. Confirmed correct patient, procedure, site, and patient consented.   Anesthesia Topical anesthesia was used. Anesthetic medications included Lidocaine 2%, Proparacaine 0.5%.   Procedure Preparation included 5% betadine to ocular surface, eyelid speculum. A (32g) needle was used.   Injection: 1.25 mg Bevacizumab 1.'25mg'$ /0.80m   Route: Intravitreal, Site: Right Eye   NDC: 5H061816 Lot:: [id number] Expiration date: 01/26/2023   Post-op Post injection exam found visual acuity of at least counting fingers. The patient tolerated the procedure well. There were no complications. The patient received written and verbal post procedure care education.      Intravitreal Injection, Pharmacologic Agent - OS - Left Eye       Time Out 01/12/2023. 10:33 AM. Confirmed correct patient,  procedure, site, and patient consented.   Anesthesia Topical anesthesia was used. Anesthetic medications included Lidocaine 2%, Proparacaine 0.5%.   Procedure Preparation included 5% betadine to ocular surface, eyelid speculum. A (32g) needle was used.   Injection: 1.25 mg Bevacizumab 1.'25mg'$ /0.058m  Route: Intravitreal, Site: Left Eye   NDC: 50H061816Lot: : [id number] date: 02/26/2023   Post-op Post injection exam found visual acuity of at least counting fingers. The patient tolerated the procedure well. There were no complications. The patient received written and verbal post procedure care education.            ASSESSMENT/PLAN:    ICD-10-CM   1. Exudative age-related macular degeneration of right eye with active choroidal neovascularization (HCC)  H35.3211 OCT, Retina - OU - Both Eyes    Intravitreal Injection, Pharmacologic Agent - OD - Right Eye    Bevacizumab (AVASTIN) SOLN 1.25 mg    2. Exudative age-related macular degeneration of left eye with active choroidal neovascularization (HCC)  H35.3221 OCT, Retina - OU - Both Eyes    Intravitreal Injection, Pharmacologic Agent - OS - Left Eye    Bevacizumab (AVASTIN) SOLN 1.25 mg    3. 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 Essential hypertension  I10     5. Hypertensive retinopathy of both eyes  H35.033     6. Pseudophakia, both eyes  Z96.1      1.  Exudative age related macular degeneration, OD  - s/p IVA OD #1 (09.15.23), #2 (10.16.23), #3 (11.14.23), #4 (12.12.23)  - interval increase in peripheral PED size and development of focal SRF superior periphery on 09.11.23 visit  - BCVA 20/20 -- stable  - OCT shows OD: PED superior periphery -- caught on high density widefield -- stable PED size and stable improvement in SRF on nasal border at 5 weeks  -  recommend IVA OD #5 today, 1.16.24 with extension to 6 weeks - pt wishes to be treated with IVA - RBA of procedure discussed,  questions answered - IVA informed consent obtained and signed, 01.16.24 (OU) - see procedure note   - f/u in 6 wks -- DFE/OCT, possible injection  2. Exudative age related macular degeneration, left eye   - The incidence pathology and anatomy of wet AMD discussed   - The ANCHOR, MARINA, CATT and VIEW trials discussed with patient.    - discussed treatment options including observation vs intravitreal anti-VEGF agents such as Avastin, Lucentis, Eylea.    - Risks of endophthalmitis and vascular occlusive events and atrophic changes discussed with patient  - exam shows new subretinal hemorrhage superior periphery adjacent to focal PED  - OCT shows interval development of focal SRH just temporal to focal PED  - recommend IVA OS #1 today, 01.16.24 for new SRH  - pt in agreement  - RBA of procedure discussed, questions answered - informed consent obtained and signed - see procedure note - f/u in 6 wks, DFE, OCT, possible injxn  3. PED OU  - exam shows focal PEDs w/ +subretinal heme OU  - OD 1200 equator  - OS 0130 midzone  - OCT shows OD: PED superior periphery -- caught on high density widefield -- stable PED size and stable improvement in SRF on nasal border; OS: Small, focal PED IN to fovea, 2 focal PEDs ST periphery caught on high density widefield -- small focal pocket of SRF converted to [facility] overlying more nasal PED, no IRF -- stable; new SRHM on temporal edge of temporal PED   - BCVA 20/25 OD, 20/15 OS -- asymptomatic  - no retinal tear or retinal detachment  - discussed findings, prognosis  - unclear etiology of focal PEDs -- differential includes ARMD variant, PEHCR, PCV  - recommend IVA OD #5 and IVA OS #1 today, 01.16.24 as above  - f/u 6 weeks DFE, high density OCT (OD 1200, OS 0130)  4,5. Hypertensive retinopathy OU - discussed importance of tight BP control - monitor  6. Pseudophakia OU  - s/p CE/IOL OU  - IOLs in good position  - monitor  Ophthalmic Meds Ordered this  visit:  Meds ordered this encounter  Medications   Bevacizumab (AVASTIN) SOLN 1.25 mg   Bevacizumab (AVASTIN) SOLN 1.25 mg     Return in about 6 weeks (around 02/23/2023) for f/u exu ARMD OD, DFE, OCT.  There are no Patient Instructions on file for this visit.   Explained the diagnoses, plan, and follow up with the patient and they expressed understanding.  Patient expressed understanding of the importance of proper follow up care.   This document serves as a record of services personally performed by Gardiner Sleeper, MD, PhD. It was created on their behalf by Renaldo Reel, [place] an ophthalmic technician. The creation of this record is the provider's dictation and/or activities during the visit.    Electronically signed by:  Renaldo Reel, COT  1.02.24 9:48 PM  This document serves as a record of services personally performed by Gardiner Sleeper, MD, PhD. It was created on their behalf by San Jetty. Owens Shark, OA an ophthalmic technician. The creation of this record is the provider's dictation and/or activities during the visit.    Electronically signed by: San Jetty. Owens Shark, [place] 01.16.2024 9:48 PM  Gardiner Sleeper, M.D., Ph.D. Diseases & Surgery of the Retina and [place]  I have reviewed the above documentation for accuracy and completeness, and I agree with the above. Gardiner Sleeper, M.D., Ph.D. 01/12/23 9:53 PM   Abbreviations: M myopia (nearsighted); A astigmatism; H hyperopia (farsighted); P presbyopia; Mrx spectacle prescription;  CTL contact lenses; OD right eye; OS left eye; OU both eyes  XT exotropia; ET esotropia; PEK punctate epithelial keratitis; PEE punctate epithelial erosions; DES dry eye syndrome; MGD meibomian gland dysfunction; ATs artificial tears; PFAT's preservative free artificial tears; [place] nuclear sclerotic cataract; PSC posterior subcapsular cataract; ERM epi-retinal membrane; PVD posterior vitreous detachment; RD retinal  detachment; DM diabetes mellitus; DR diabetic retinopathy; NPDR non-proliferative diabetic retinopathy; PDR proliferative diabetic retinopathy; CSME clinically significant macular edema; DME diabetic macular edema; dbh dot blot hemorrhages; CWS cotton wool spot; POAG primary open angle glaucoma; C/D cup-to-disc ratio; HVF humphrey visual field; GVF goldmann visual field; OCT optical coherence tomography; IOP intraocular pressure; BRVO Branch retinal vein occlusion; CRVO central retinal vein occlusion; CRAO central retinal artery occlusion; BRAO branch retinal artery occlusion; RT retinal tear; SB scleral buckle; PPV pars plana vitrectomy; VH Vitreous hemorrhage; PRP panretinal laser photocoagulation; IVK intravitreal kenalog; VMT vitreomacular traction; MH Macular hole;  NVD neovascularization of the disc; NVE neovascularization elsewhere; AREDS age related eye disease study; ARMD age related macular degeneration; POAG primary open angle glaucoma; EBMD epithelial/anterior basement membrane dystrophy; ACIOL anterior chamber intraocular lens; IOL intraocular lens; PCIOL posterior chamber intraocular lens; Phaco/IOL phacoemulsification with intraocular lens placement; [place] photorefractive keratectomy; LASIK laser assisted in situ keratomileusis; HTN hypertension; DM diabetes mellitus; COPD chronic obstructive pulmonary disease

## 2023-01-12 ENCOUNTER — Ambulatory Visit (INDEPENDENT_AMBULATORY_CARE_PROVIDER_SITE_OTHER): Payer: Medicare HMO | Admitting: Ophthalmology

## 2023-01-12 ENCOUNTER — Encounter (INDEPENDENT_AMBULATORY_CARE_PROVIDER_SITE_OTHER): Payer: Self-pay | Admitting: Ophthalmology

## 2023-01-12 DIAGNOSIS — H353231 Exudative age-related macular degeneration, bilateral, with active choroidal neovascularization: Secondary | ICD-10-CM

## 2023-01-12 DIAGNOSIS — H353211 Exudative age-related macular degeneration, right eye, with active choroidal neovascularization: Secondary | ICD-10-CM

## 2023-01-12 DIAGNOSIS — H35033 Hypertensive retinopathy, bilateral: Secondary | ICD-10-CM

## 2023-01-12 DIAGNOSIS — H353221 Exudative age-related macular degeneration, left eye, with active choroidal neovascularization: Secondary | ICD-10-CM

## 2023-01-12 DIAGNOSIS — I10 Essential (primary) hypertension: Secondary | ICD-10-CM

## 2023-01-12 DIAGNOSIS — Z961 Presence of intraocular lens: Secondary | ICD-10-CM | POA: Diagnosis not present

## 2023-01-12 DIAGNOSIS — H35723 Serous detachment of retinal pigment epithelium, bilateral: Secondary | ICD-10-CM

## 2023-01-12 MED ORDER — BEVACIZUMAB CHEMO INJECTION 1.25MG/0.05ML SYRINGE FOR KALEIDOSCOPE
1.2500 mg | INTRAVITREAL | Status: AC | PRN
  Administered 2023-01-12: 1.25 mg via INTRAVITREAL

## 2023-02-09 NOTE — Progress Notes (Signed)
[facility] Note  02/23/2023     CHIEF COMPLAINT Patient presents for Retina Follow Up   HISTORY OF PRESENT ILLNESS: Marissa Mullen is a 82 y.o. female who presents to the clinic today for:   HPI     Retina Follow Up   Patient presents with  Wet AMD.  In right eye.  This started 6 weeks ago.  I, the attending physician,  performed the HPI with the patient and updated documentation appropriately.      Last edited by Bernarda Caffey, MD on 02/23/2023 10:14 PM.    Pt states vision is stable, she states she had no problem with the first injection in her left eye at last visit  Referring physician: Maryland Pink, MD [address] [place],  [place] [address]  HISTORICAL INFORMATION:   Selected notes from the MEDICAL RECORD NUMBER Referred by Dr. Ellin Mayhew for concern of operculated hole LEE:  Ocular Hx- PMH-    CURRENT MEDICATIONS: No current outpatient medications on file. (Ophthalmic Drugs)   No current facility-administered medications for this visit. (Ophthalmic Drugs)   Current Outpatient Medications (Other)  Medication Sig   acetaminophen (TYLENOL) 650 MG CR tablet Take 650-1,300 mg by mouth every 8 (eight) hours as needed for pain.    Calcium Carb-Cholecalciferol 600-800 MG-UNIT TABS Take 2 tablets by mouth daily.   pantoprazole (PROTONIX) 40 MG tablet Take 40 mg by mouth daily.   simvastatin (ZOCOR) 20 MG tablet Take 20 mg by mouth daily.   Multiple Vitamins-Minerals (WOMENS MULTIVITAMIN PLUS) TABS Take 1 tablet by mouth daily.   No current facility-administered medications for this visit. (Other)   REVIEW OF SYSTEMS: ROS   Positive for: Eyes Negative for: Constitutional, Gastrointestinal, Neurological, Skin, Genitourinary, Musculoskeletal, HENT, Endocrine, Cardiovascular, Respiratory, Psychiatric, Allergic/Imm, Heme/Lymph Last edited by Bernarda Caffey, MD on 02/23/2023 10:14 PM.      ALLERGIES Allergies   Allergen Reactions   Ondansetron     Other reaction(s): Abdominal Pain   Oxycodone Itching   Tramadol Itching    Unknown    PAST MEDICAL HISTORY Past Medical History:  Diagnosis Date   Arthritis    Colon polyps    Diverticulosis    Esophageal stricture    GERD (gastroesophageal reflux disease)    Hepatitis    "Age 68"   Hypercholesterolemia    Hypertension    Past Surgical History:  Procedure Laterality Date   ABDOMINAL HYSTERECTOMY     CATARACT EXTRACTION     COLONOSCOPY WITH PROPOFOL N/A 03/16/2020   Procedure: COLONOSCOPY WITH PROPOFOL;  Surgeon: Toledo, Benay Pike, MD;  Location: [facility] ENDOSCOPY;  Service: Gastroenterology;  Laterality: N/A;   COLONOSCOPY WITH PROPOFOL N/A 09/26/2021   Procedure: COLONOSCOPY WITH PROPOFOL;  Surgeon: Lesly Rubenstein, MD;  Location: [facility] ENDOSCOPY;  Service: Endoscopy;  Laterality: N/A;   PARTIAL KNEE ARTHROPLASTY Left 01/23/2016   Procedure: UNICOMPARTMENTAL KNEE;  Surgeon: Corky Mull, MD;  Location: [facility] ORS;  Service: Orthopedics;  Laterality: Left;   FAMILY HISTORY Family History  Problem Relation Age of Onset   Diabetes Mother    Diabetes Daughter    Breast cancer Daughter 68   SOCIAL HISTORY Social History   Tobacco Use   Smoking status: Former    Packs/day: 0.50    Types: Cigarettes   Smokeless tobacco: Never  Vaping Use   Vaping Use: Never used  Substance Use Topics   Alcohol use: No   Drug use: No  OPHTHALMIC EXAM:  Base Eye Exam     Visual Acuity (Snellen - Linear)       Right Left   Dist [place] 20/20 -2 20/15 -2         Tonometry (Tonopen, 9:33 AM)       Right Left   Pressure 09 10         Pupils       Dark Light Shape React APD   Right 3 2 Round Brisk None   Left 3 2 Round Brisk None         Visual Fields (Counting fingers)       Left Right    Full Full         Extraocular Movement       Right Left    Full, Ortho Full, Ortho         Neuro/Psych     Oriented x3:  Yes   Mood/Affect: Normal         Dilation     Both eyes: 1.0% Mydriacyl, 2.5% Phenylephrine @ 9:33 AM           Slit Lamp and Fundus Exam     Slit Lamp Exam       Right Left   Lids/Lashes Dermatochalasis - upper lid Dermatochalasis - upper lid   Conjunctiva/Sclera White and quiet White and quiet   Cornea tear film debris, well healed cataract wound tear film debris, well healed cataract wound   Anterior Chamber Deep and quiet Deep and quiet   Iris Round and dilated Round and dilated   Lens PC IOL in good position PC IOL in good position   Anterior Vitreous Vitreous syneresis Vitreous syneresis         Fundus Exam       Right Left   Disc Pink and Sharp, Compact Pink and Sharp, Compact   C/D Ratio 0.1 0.1   Macula Flat, Good foveal reflex, RPE mottling, No heme or edema Flat, Good foveal reflex, RPE mottling, No heme or edema   Vessels attenuated, Tortuous attenuated, Tortuous   Periphery Attached, focal PED w/ improving SRH at 1200 -- SRH almost completely white, no RT/RD, no new lesions Attached, mild reticular degeneration, 2 focal SRH (temporal one larger) at 0130 midzone; nasal one essentially resolved, temporal one with persistent fibrotic SRH, no RT/RD, no new lesions           IMAGING AND PROCEDURES  Imaging and Procedures for 02/23/2023  OCT, Retina - OU - Both Eyes       Right Eye Quality was good. Central Foveal Thickness: 276. Progression has been stable. Findings include normal foveal contour, no IRF, no SRF, vitreomacular adhesion (PED superior periphery -- caught on high density widefield -- stable PED size and stable improvement in SRF on nasal border).   Left Eye Quality was good. Central Foveal Thickness: 309. Progression has improved. Findings include normal foveal contour, no IRF, no SRF, pigment epithelial detachment, vitreomacular adhesion (Small, focal PED IN to fovea, 2 focal PEDs ST periphery caught on high density widefield -- small focal  pocket of SRF converted to [facility] overlying more nasal PED, no IRF -- stable; SRHM on temporal edge of temporal PED -- both slightly improved).   Notes *Images captured and stored on drive  Diagnosis / Impression:  NFP; no IRF/SRF centrally OU OD: PED superior periphery -- caught on high density widefield -- stable PED size and stable improvement in SRF on nasal border OS: Small,  focal PED IN to fovea, 2 focal PEDs ST periphery caught on high density widefield -- small focal pocket of SRF converted to [facility] overlying more nasal PED, no IRF -- stable; SRHM on temporal edge of temporal PED -- both slightly improved  Clinical management:  See below  Abbreviations: NFP - Normal foveal profile. CME - cystoid macular edema. PED - pigment epithelial detachment. IRF - intraretinal fluid. SRF - subretinal fluid. EZ - ellipsoid zone. ERM - epiretinal membrane. ORA - outer retinal atrophy. ORT - outer retinal tubulation. SRHM - subretinal hyper-reflective material. IRHM - intraretinal hyper-reflective material      Intravitreal Injection, Pharmacologic Agent - OD - Right Eye       Time Out 02/23/2023. 10:26 AM. Confirmed correct patient, procedure, site, and patient consented.   Anesthesia Topical anesthesia was used. Anesthetic medications included Lidocaine 2%, Proparacaine 0.5%.   Procedure Preparation included 5% betadine to ocular surface, eyelid speculum. A (32g) needle was used.   Injection: 1.25 mg Bevacizumab 1.'25mg'$ /0.2m   Route: Intravitreal, Site: Right Eye   NDC: [id number] Lot: [id number]'$ , Expiration date: 03/12/2023   Post-op Post injection exam found visual acuity of at least counting fingers. The patient tolerated the procedure well. There were no complications. The patient received written and verbal post procedure care education.      Intravitreal Injection, Pharmacologic Agent - OS - Left Eye       Time Out 02/23/2023. 10:27 AM. Confirmed correct patient, procedure,  site, and patient consented.   Anesthesia Topical anesthesia was used. Anesthetic medications included Lidocaine 2%, Proparacaine 0.5%.   Procedure Preparation included 5% betadine to ocular surface, eyelid speculum. A (32g) needle was used.   Injection: 1.25 mg Bevacizumab 1.'25mg'$ /0.032m  Route: Intravitreal, Site: Left Eye   NDC: 5080mLot: [id number] date: 05/08/2023   Post-op Post injection exam found visual acuity of at least counting fingers. The patient tolerated the procedure well. There were no complications. The patient received written and verbal post procedure care education.            ASSESSMENT/PLAN:    ICD-10-CM   1. Exudative age-related macular degeneration of right eye with active choroidal neovascularization (HCC)  H35.3211 OCT, Retina - OU - Both Eyes    Intravitreal Injection, Pharmacologic Agent - OD - Right Eye    Bevacizumab (AVASTIN) SOLN 1.25 mg    2. Exudative age-related macular degeneration of left eye with active choroidal neovascularization (HCC)  H35.3221 Intravitreal Injection, Pharmacologic Agent - OS - Left Eye    Bevacizumab (AVASTIN) SOLN 1.25 mg    3. 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 Essential hypertension  I10     5. Hypertensive retinopathy of both eyes  H35.033     6. Pseudophakia, both eyes  Z96.1      1.  Exudative age related macular degeneration, OD  - s/p IVA OD #1 (09.15.23), #2 (10.16.23), #3 (11.14.23), #4 (12.12.23), #5 (01.16.24)  - interval increase in peripheral PED size and development of focal SRF superior periphery on 09.11.23 visit  - BCVA 20/20 -- stable  - OCT shows OD: PED superior periphery -- caught on high density widefield -- stable PED size and stable improvement in SRF on nasal border at 6 weeks  - recommend IVA OD #6 today, 02.27.24 with extension to 6 weeks - pt wishes to be treated with IVA - RBA of procedure discussed, questions answered - IVA  informed consent obtained and  signed, 01.16.24 (OU) - see procedure note   - f/u in 6 wks -- DFE/OCT, possible injection  2. Exudative age related macular degeneration, left eye   - s/p IVA OS #1 (01.16.24) for focal peripheral subretinal hemorrhage  - exam shows interval improvement in subretinal hemorrhage superior periphery adjacent to focal PED  - OCT shows interval improvement in focal [place] just temporal to focal PED at 6 wks  - recommend IVA OS #2 today, 02.27.24  - pt in agreement  - RBA of procedure discussed, questions answered - informed consent obtained and signed - see procedure note - f/u in 6 wks, DFE, OCT, possible injxn  3. PED OU  - exam shows focal PEDs w/ +subretinal heme OU  - OD 1200 equator  - OS 0130 midzone  - OCT shows OD: PED superior periphery -- caught on high density widefield -- stable PED size and stable improvement in SRF on nasal border; OS: Small, focal PED IN to fovea, 2 focal PEDs ST periphery caught on high density widefield -- small focal pocket of SRF converted to [facility] overlying more nasal PED, no IRF -- stable; SRHM on temporal edge of temporal PED -- both improved  - BCVA 20/25 OD, 20/15 OS -- asymptomatic  - no retinal tear or retinal detachment  - discussed findings, prognosis  - unclear etiology of focal PEDs -- differential includes ARMD variant, PEHCR, PCV  - recommend IVA OD #6 and IVA OS #2 today, 02.27.24 as above  - f/u 6 weeks DFE, high density OCT (OD 1200, OS 0130)  4,5. Hypertensive retinopathy OU - discussed importance of tight BP control - monitor  6. Pseudophakia OU  - s/p CE/IOL OU  - IOLs in good position  - monitor  Ophthalmic Meds Ordered this visit:  Meds ordered this encounter  Medications   Bevacizumab (AVASTIN) SOLN 1.25 mg   Bevacizumab (AVASTIN) SOLN 1.25 mg     Return in about 6 weeks (around 04/06/2023) for f/u exu ARMD OU, DFE, OCT.  There are no Patient Instructions on file for this visit.   Explained  the diagnoses, plan, and follow up with the patient and they expressed understanding.  Patient expressed understanding of the importance of proper follow up care.   This document serves as a record of services personally performed by Gardiner Sleeper, MD, PhD. It was created on their behalf by Renaldo Reel, [place] an ophthalmic technician. The creation of this record is the provider's dictation and/or activities during the visit.    Electronically signed by:  Renaldo Reel, COT  02.13.24 10:15 PM  This document serves as a record of services personally performed by Gardiner Sleeper, MD, PhD. It was created on their behalf by San Jetty. Owens Shark, OA an ophthalmic technician. The creation of this record is the provider's dictation and/or activities during the visit.    Electronically signed by: San Jetty. Owens Shark, [place] 02.27.2024 10:15 PM  Gardiner Sleeper, M.D., Ph.D. Diseases & Surgery of the Retina and Vitreous [facility]  I have reviewed the above documentation for accuracy and completeness, and I agree with the above. Gardiner Sleeper, M.D., Ph.D. 02/23/23 10:18 PM  Abbreviations: M myopia (nearsighted); A astigmatism; H hyperopia (farsighted); P presbyopia; Mrx spectacle prescription;  CTL contact lenses; OD right eye; OS left eye; OU both eyes  XT exotropia; ET esotropia; PEK punctate epithelial keratitis; PEE punctate epithelial erosions; DES dry eye syndrome; MGD meibomian gland dysfunction; ATs artificial tears;  PFAT's preservative free artificial tears; [place] nuclear sclerotic cataract; PSC posterior subcapsular cataract; ERM epi-retinal membrane; PVD posterior vitreous detachment; RD retinal detachment; DM diabetes mellitus; DR diabetic retinopathy; NPDR non-proliferative diabetic retinopathy; PDR proliferative diabetic retinopathy; CSME clinically significant macular edema; DME diabetic macular edema; dbh dot blot hemorrhages; CWS cotton wool spot; POAG primary open angle  glaucoma; C/D cup-to-disc ratio; HVF humphrey visual field; GVF goldmann visual field; OCT optical coherence tomography; IOP intraocular pressure; BRVO Branch retinal vein occlusion; CRVO central retinal vein occlusion; CRAO central retinal artery occlusion; BRAO branch retinal artery occlusion; RT retinal tear; SB scleral buckle; PPV pars plana vitrectomy; VH Vitreous hemorrhage; PRP panretinal laser photocoagulation; IVK intravitreal kenalog; VMT vitreomacular traction; MH Macular hole;  NVD neovascularization of the disc; NVE neovascularization elsewhere; AREDS age related eye disease study; ARMD age related macular degeneration; POAG primary open angle glaucoma; EBMD epithelial/anterior basement membrane dystrophy; ACIOL anterior chamber intraocular lens; IOL intraocular lens; PCIOL posterior chamber intraocular lens; Phaco/IOL phacoemulsification with intraocular lens placement; [place] photorefractive keratectomy; LASIK laser assisted in situ keratomileusis; HTN hypertension; DM diabetes mellitus; COPD chronic obstructive pulmonary disease

## 2023-02-23 ENCOUNTER — Encounter (INDEPENDENT_AMBULATORY_CARE_PROVIDER_SITE_OTHER): Payer: Self-pay | Admitting: Ophthalmology

## 2023-02-23 ENCOUNTER — Ambulatory Visit (INDEPENDENT_AMBULATORY_CARE_PROVIDER_SITE_OTHER): Payer: Medicare HMO | Admitting: Ophthalmology

## 2023-02-23 DIAGNOSIS — H353221 Exudative age-related macular degeneration, left eye, with active choroidal neovascularization: Secondary | ICD-10-CM

## 2023-02-23 DIAGNOSIS — H35723 Serous detachment of retinal pigment epithelium, bilateral: Secondary | ICD-10-CM

## 2023-02-23 DIAGNOSIS — I10 Essential (primary) hypertension: Secondary | ICD-10-CM

## 2023-02-23 DIAGNOSIS — H353211 Exudative age-related macular degeneration, right eye, with active choroidal neovascularization: Secondary | ICD-10-CM

## 2023-02-23 DIAGNOSIS — Z961 Presence of intraocular lens: Secondary | ICD-10-CM

## 2023-02-23 DIAGNOSIS — H353231 Exudative age-related macular degeneration, bilateral, with active choroidal neovascularization: Secondary | ICD-10-CM | POA: Diagnosis not present

## 2023-02-23 DIAGNOSIS — H35033 Hypertensive retinopathy, bilateral: Secondary | ICD-10-CM

## 2023-02-23 MED ORDER — BEVACIZUMAB CHEMO INJECTION 1.25MG/0.05ML SYRINGE FOR KALEIDOSCOPE
1.2500 mg | INTRAVITREAL | Status: AC | PRN
  Administered 2023-02-23: 1.25 mg via INTRAVITREAL

## 2023-03-23 NOTE — Progress Notes (Signed)
[facility] - Clinic Note  04/06/2023     CHIEF COMPLAINT Patient presents for Retina Follow Up   HISTORY OF PRESENT ILLNESS: Marissa Mullen is a 82 y.o. female who presents to the clinic today for:   HPI     Retina Follow Up   Patient presents with  Wet AMD.  In both eyes.  This started 6 weeks ago.  Duration of 6 weeks.  Since onset it is stable.  I, the attending physician,  performed the HPI with the patient and updated documentation appropriately.        Comments   6 week retina follow up AMD and IVA OU pt is reporting no vision changes noticed she denies any flashes or floaters      Last edited by Rennis ChrisZamora, Sherrine Salberg, MD on 04/06/2023  5:23 PM.     Referring physician: Isla PenceWoodard, D Reid, OD [address] [place] [id number]  HISTORICAL INFORMATION:   Selected notes from the MEDICAL RECORD NUMBER Referred by Dr. Clydene PughWoodard for concern of operculated hole LEE:  Ocular Hx- PMH-    CURRENT MEDICATIONS: No current outpatient medications on file. (Ophthalmic Drugs)   No current facility-administered medications for this visit. (Ophthalmic Drugs)   Current Outpatient Medications (Other)  Medication Sig   acetaminophen (TYLENOL) 650 MG CR tablet Take 650-1,300 mg by mouth every 8 (eight) hours as needed for pain.    Calcium Carb-Cholecalciferol 600-800 MG-UNIT TABS Take 2 tablets by mouth daily.   Multiple Vitamins-Minerals (WOMENS MULTIVITAMIN PLUS) TABS Take 1 tablet by mouth daily.   pantoprazole (PROTONIX) 40 MG tablet Take 40 mg by mouth daily.   simvastatin (ZOCOR) 20 MG tablet Take 20 mg by mouth daily.   No current facility-administered medications for this visit. (Other)   REVIEW OF SYSTEMS: ROS   Positive for: Eyes Negative for: Constitutional, Gastrointestinal, Neurological, Skin, Genitourinary, Musculoskeletal, HENT, Endocrine, Cardiovascular, Respiratory, Psychiatric, Allergic/Imm, Heme/Lymph Last edited by Etheleen MayhewMendenhall, Jennifer W, COT  on 04/06/2023  8:57 AM.       ALLERGIES Allergies  Allergen Reactions   Ondansetron     Other reaction(s): Abdominal Pain   Oxycodone Itching   Tramadol Itching    Unknown    PAST MEDICAL HISTORY Past Medical History:  Diagnosis Date   Arthritis    Colon polyps    Diverticulosis    Esophageal stricture    GERD (gastroesophageal reflux disease)    Hepatitis    "Age 38"   Hypercholesterolemia    Hypertension    Past Surgical History:  Procedure Laterality Date   ABDOMINAL HYSTERECTOMY     CATARACT EXTRACTION     COLONOSCOPY WITH PROPOFOL N/A 03/16/2020   Procedure: COLONOSCOPY WITH PROPOFOL;  Surgeon: Toledo, Boykin Nearingeodoro K, MD;  Location: [facility];  Service: Gastroenterology;  Laterality: N/A;   COLONOSCOPY WITH PROPOFOL N/A 09/26/2021   Procedure: COLONOSCOPY WITH PROPOFOL;  Surgeon: Regis BillLocklear, Cameron T, MD;  Location: [facility] ENDOSCOPY;  Service: Endoscopy;  Laterality: N/A;   PARTIAL KNEE ARTHROPLASTY Left 01/23/2016   Procedure: UNICOMPARTMENTAL KNEE;  Surgeon: Christena FlakeJohn J Poggi, MD;  Location: [facility] ORS;  Service: Orthopedics;  Laterality: Left;   FAMILY HISTORY Family History  Problem Relation Age of Onset   Diabetes Mother    Diabetes Daughter    Breast cancer Daughter [id number]   SOCIAL HISTORY Social History   Tobacco Use   Smoking status: Former    Packs/day: .5    Types: Cigarettes   Smokeless tobacco: Never  Vaping Use   Vaping Use: Never used  Substance Use Topics   Alcohol use: No   Drug use: No       OPHTHALMIC EXAM:  Base Eye Exam     Visual Acuity (Snellen - Linear)       Right Left   Dist [place] 20/25 -3 20/20   Dist ph [place] NI          Tonometry (Tonopen, 9:01 AM)       Right Left   Pressure 10 12         Pupils       Pupils Dark Light Shape React APD   Right PERRL 3 2 Round Brisk None   Left PERRL 3 2 Round Brisk None         Visual Fields       Left Right    Full Full         Extraocular Movement       Right Left     Full, Ortho Full, Ortho         Neuro/Psych     Oriented x3: Yes   Mood/Affect: Normal         Dilation     Both eyes: 2.5% Phenylephrine @ 9:01 AM           Slit Lamp and Fundus Exam     Slit Lamp Exam       Right Left   Lids/Lashes Dermatochalasis - upper lid Dermatochalasis - upper lid   Conjunctiva/Sclera White and quiet White and quiet   Cornea tear film debris, well healed cataract wound tear film debris, well healed cataract wound   Anterior Chamber Deep and quiet Deep and quiet   Iris Round and dilated Round and dilated   Lens PC IOL in good position PC IOL in good position   Anterior Vitreous Vitreous syneresis Vitreous syneresis         Fundus Exam       Right Left   Disc Pink and Sharp, Compact Pink and Sharp, Compact   C/D Ratio 0.1 0.1   Macula Flat, Good foveal reflex, RPE mottling, No heme or edema Flat, Good foveal reflex, RPE mottling, No heme or edema   Vessels attenuated, Tortuous attenuated, Tortuous   Periphery Attached, focal PED w/ improving SRH at 1200 -- SRH almost completely resolved, no RT/RD, no new lesions Attached, mild reticular degeneration, 2 focal SRH (temporal one larger) at 0130 midzone; nasal one essentially resolved--focal pigment clump, temporal one with persistent fibrotic SRH--improving, no RT/RD, no new lesions           IMAGING AND PROCEDURES  Imaging and Procedures for 04/06/2023  OCT, Retina - OU - Both Eyes       Right Eye Quality was good. Central Foveal Thickness: 272. Progression has been stable. Findings include normal foveal contour, no IRF, no SRF, vitreomacular adhesion (PED superior periphery -- caught on high density widefield -- stable PED size and stable improvement in SRF on nasal border).   Left Eye Quality was good. Central Foveal Thickness: 304. Progression has improved. Findings include normal foveal contour, no IRF, no SRF, pigment epithelial detachment, vitreomacular adhesion (Small, focal PED  IN to fovea, 2 focal PEDs ST periphery caught on high density widefield -- small focal pocket of SRF [facility] to [facility] overlying more nasal PED, no IRF -- stable; SRHM on temporal edge of temporal PED -- both slightly improved).   Notes *Images captured and stored on  drive  Diagnosis / Impression:  NFP; no IRF/SRF centrally OU OD: PED superior periphery -- caught on high density widefield -- stable PED size and stable improvement in surrounding SRF on nasal border OS: Small, focal PED IN to fovea, 2 focal PEDs ST periphery caught on high density widefield -- small focal pocket of SRF converted to [facility] overlying more nasal PED, no IRF -- improved; SRHM on temporal edge of temporal PED -- improved  Clinical management:  See below  Abbreviations: NFP - Normal foveal profile. CME - cystoid macular edema. PED - pigment epithelial detachment. IRF - intraretinal fluid. SRF - subretinal fluid. EZ - ellipsoid zone. ERM - epiretinal membrane. ORA - outer retinal atrophy. ORT - outer retinal tubulation. SRHM - subretinal hyper-reflective material. IRHM - intraretinal hyper-reflective material      Intravitreal Injection, Pharmacologic Agent - OS - Left Eye       Time Out 04/06/2023. 9:30 AM. Confirmed correct patient, procedure, site, and patient consented.   Anesthesia No anesthesia was used. Anesthetic medications included Lidocaine 2%, Proparacaine 0.5%.   Procedure Preparation included 5% betadine to ocular surface, eyelid speculum. A supplied needle was used.   Injection: 1.25 mg Bevacizumab 1.25mg /0.79ml   Route: Intravitreal, Site: Left Eye   NDC: [id number], Lot: [id number], Expiration date: 05/15/2023   Post-op Post injection exam found visual acuity of at least counting fingers. The patient tolerated the procedure well. There were no complications. The patient received written and verbal post procedure care education. Post injection medications were not given.             ASSESSMENT/PLAN:    ICD-10-CM   1. Exudative age-related macular degeneration of right eye with active choroidal neovascularization  H35.3211 OCT, Retina - OU - Both Eyes    CANCELED: Intravitreal Injection, Pharmacologic Agent - OD - Right Eye    2. Exudative age-related macular degeneration of left eye with active choroidal neovascularization  H35.3221 OCT, Retina - OU - Both Eyes    Intravitreal Injection, Pharmacologic Agent - OS - Left Eye    Bevacizumab (AVASTIN) SOLN 1.25 mg    3. 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 Essential hypertension  I10     5. Hypertensive retinopathy of both eyes  H35.033     6. Pseudophakia, both eyes  Z96.1       1.  Exudative age related macular degeneration, OD  - s/p IVA OD #1 (09.15.23), #2 (10.16.23), #3 (11.14.23), #4 (12.12.23), #5 (01.16.24), #6 (02.27.24)  - interval increase in peripheral PED size and development of focal SRF superior periphery on 09.11.23 visit  - BCVA 20/25-3--slightly worse  - OCT shows OD: PED superior periphery -- caught on high density widefield -- stable PED size and stable improvement in SRF on nasal border at 6 weeks  - exam without active heme or fluid surrounding PED / CNV  - recommend holding injection OD today - pt in agreement - IVA informed consent obtained and signed, 01.16.24 (OU)  - f/u in 6 wks -- DFE/OCT, possible injection  2. Exudative age related macular degeneration, left eye   - s/p IVA OS #1 (01.16.24) for focal peripheral subretinal hemorrhage, #2 (02.27.24)  - exam shows interval improvement in subretinal hemorrhage superior periphery adjacent to focal PED -- turning white and shrinking  - OCT shows interval improvement in focal [facility] just temporal to focal PED at 6 wks  - recommend IVA  OS #3 today, 04.09.24  - pt in agreement  - RBA of procedure discussed, questions answered - informed consent obtained and signed - see procedure  note - f/u in 6 wks, DFE, OCT, possible injxn  3. PED OU  - exam shows focal PEDs w/ +subretinal heme OU  - OD 1200 equator  - OS 0130 midzone  - OCT shows OD: PED superior periphery -- caught on high density widefield -- stable PED size and stable improvement in surrounding SRF on nasal border; OS: Small, focal PED IN to fovea, 2 focal PEDs ST periphery caught on high density widefield -- small focal pocket of SRF converted to [facility] overlying more nasal PED, no IRF -- improved; SRHM on temporal edge of temporal PED -- improved  - BCVA 20/25-3 OD, 20/20 OS -- asymptomatic  - no retinal tear or retinal detachment  - discussed findings, prognosis  - unclear etiology of focal PEDs -- differential includes ARMD variant, PEHCR, PCV  - recommend IVA OS #3 today, 04.09.24 as above  - f/u 6 weeks DFE, high density OCT (OD 1200, OS 0130)  4,5. Hypertensive retinopathy OU - discussed importance of tight BP control - monitor  6. Pseudophakia OU  - s/p CE/IOL OU  - IOLs in good position  - monitor  Ophthalmic Meds Ordered this visit:  Meds ordered this encounter  Medications   Bevacizumab (AVASTIN) SOLN 1.25 mg     Return in about 6 weeks (around 05/18/2023) for peripheral CNVs / ex ARMD OU - DFE, OCT, Possible Injxn.  There are no Patient Instructions on file for this visit.   Explained the diagnoses, plan, and follow up with the patient and they expressed understanding.  Patient expressed understanding of the importance of proper follow up care.   This document serves as a record of services personally performed by Karie Chimera, MD, PhD. It was created on their behalf by Annalee Genta, COMT. The creation of this record is the provider's dictation and/or activities during the visit.  Electronically signed by: Annalee Genta, COMT 04/06/23 5:29 PM  Karie Chimera, M.D., Ph.D. Diseases & Surgery of the Retina and Vitreous [facility]  I have reviewed the above  documentation for accuracy and completeness, and I agree with the above. Karie Chimera, M.D., Ph.D. 04/06/23 5:29 PM   Abbreviations: M myopia (nearsighted); A astigmatism; H hyperopia (farsighted); P presbyopia; Mrx spectacle prescription;  CTL contact lenses; OD right eye; OS left eye; OU both eyes  XT exotropia; ET esotropia; PEK punctate epithelial keratitis; PEE punctate epithelial erosions; DES dry eye syndrome; MGD meibomian gland dysfunction; ATs artificial tears; PFAT's preservative free artificial tears; NSC nuclear sclerotic cataract; PSC posterior subcapsular cataract; ERM epi-retinal membrane; PVD posterior vitreous detachment; RD retinal detachment; DM diabetes mellitus; DR diabetic retinopathy; NPDR non-proliferative diabetic retinopathy; PDR proliferative diabetic retinopathy; CSME clinically significant macular edema; DME diabetic macular edema; dbh dot blot hemorrhages; CWS cotton wool spot; POAG primary open angle glaucoma; C/D cup-to-disc ratio; HVF humphrey visual field; GVF goldmann visual field; OCT optical coherence tomography; IOP intraocular pressure; BRVO Branch retinal vein occlusion; CRVO central retinal vein occlusion; CRAO central retinal artery occlusion; BRAO branch retinal artery occlusion; RT retinal tear; SB scleral buckle; PPV pars plana vitrectomy; VH Vitreous hemorrhage; PRP panretinal laser photocoagulation; IVK intravitreal kenalog; VMT vitreomacular traction; MH Macular hole;  NVD neovascularization of the disc; NVE neovascularization elsewhere; AREDS age related eye disease study; ARMD age related macular degeneration; POAG primary  open angle glaucoma; EBMD epithelial/anterior basement membrane dystrophy; ACIOL anterior chamber intraocular lens; IOL intraocular lens; PCIOL posterior chamber intraocular lens; Phaco/IOL phacoemulsification with intraocular lens placement; PRK photorefractive keratectomy; LASIK laser assisted in situ keratomileusis; HTN hypertension; DM  diabetes mellitus; COPD chronic obstructive pulmonary disease

## 2023-04-06 ENCOUNTER — Encounter (INDEPENDENT_AMBULATORY_CARE_PROVIDER_SITE_OTHER): Payer: Self-pay | Admitting: Ophthalmology

## 2023-04-06 ENCOUNTER — Ambulatory Visit (INDEPENDENT_AMBULATORY_CARE_PROVIDER_SITE_OTHER): Payer: Medicare HMO | Admitting: Ophthalmology

## 2023-04-06 DIAGNOSIS — H35033 Hypertensive retinopathy, bilateral: Secondary | ICD-10-CM

## 2023-04-06 DIAGNOSIS — H35723 Serous detachment of retinal pigment epithelium, bilateral: Secondary | ICD-10-CM | POA: Diagnosis not present

## 2023-04-06 DIAGNOSIS — Z961 Presence of intraocular lens: Secondary | ICD-10-CM

## 2023-04-06 DIAGNOSIS — I10 Essential (primary) hypertension: Secondary | ICD-10-CM | POA: Diagnosis not present

## 2023-04-06 DIAGNOSIS — H353211 Exudative age-related macular degeneration, right eye, with active choroidal neovascularization: Secondary | ICD-10-CM

## 2023-04-06 DIAGNOSIS — H353221 Exudative age-related macular degeneration, left eye, with active choroidal neovascularization: Secondary | ICD-10-CM

## 2023-04-06 DIAGNOSIS — H353231 Exudative age-related macular degeneration, bilateral, with active choroidal neovascularization: Secondary | ICD-10-CM | POA: Diagnosis not present

## 2023-04-06 MED ORDER — BEVACIZUMAB CHEMO INJECTION 1.25MG/0.05ML SYRINGE FOR KALEIDOSCOPE
1.2500 mg | INTRAVITREAL | Status: AC | PRN
  Administered 2023-04-06: 1.25 mg via INTRAVITREAL

## 2023-05-04 NOTE — Progress Notes (Addendum)
[facility] - Clinic Note  05/18/2023     CHIEF COMPLAINT Patient presents for Retina Follow Up   HISTORY OF PRESENT ILLNESS: Marissa Mullen is a 82 y.o. female who presents to the clinic today for:   HPI     Retina Follow Up   Patient presents with  Wet AMD.  In both eyes.  This started weeks ago.  Duration of 6 weeks.  Since onset it is stable.  I, the attending physician,  performed the HPI with the patient and updated documentation appropriately.        Comments   Patient states that the vision is the same. She is using AT's OU PRN.       Last edited by Rennis Chris, MD on 05/18/2023 12:36 PM.    Patient is pleased with vision.   Referring physician: Isla Pence, OD [address] [place],  [place] [address]  HISTORICAL INFORMATION:   Selected notes from the MEDICAL RECORD NUMBER Referred by Dr. Clydene Pugh for concern of operculated hole LEE:  Ocular Hx- PMH-    CURRENT MEDICATIONS: No current outpatient medications on file. (Ophthalmic Drugs)   No current facility-administered medications for this visit. (Ophthalmic Drugs)   Current Outpatient Medications (Other)  Medication Sig   acetaminophen (TYLENOL) 650 MG CR tablet Take 650-1,300 mg by mouth every 8 (eight) hours as needed for pain.    Calcium Carb-Cholecalciferol 600-800 MG-UNIT TABS Take 2 tablets by mouth daily.   Multiple Vitamins-Minerals (WOMENS MULTIVITAMIN PLUS) TABS Take 1 tablet by mouth daily.   pantoprazole (PROTONIX) 40 MG tablet Take 40 mg by mouth daily.   simvastatin (ZOCOR) 20 MG tablet Take 20 mg by mouth daily.   No current facility-administered medications for this visit. (Other)   REVIEW OF SYSTEMS: ROS   Positive for: Eyes Negative for: Constitutional, Gastrointestinal, Neurological, Skin, Genitourinary, Musculoskeletal, HENT, Endocrine, Cardiovascular, Respiratory, Psychiatric, Allergic/Imm, Heme/Lymph Last edited by Julieanne Cotton, COT on 05/18/2023   9:10 AM.     ALLERGIES Allergies  Allergen Reactions   Ondansetron     Other reaction(s): Abdominal Pain   Oxycodone Itching   Tramadol Itching    Unknown    PAST MEDICAL HISTORY Past Medical History:  Diagnosis Date   Arthritis    Colon polyps    Diverticulosis    Esophageal stricture    GERD (gastroesophageal reflux disease)    Hepatitis    "Age 62"   Hypercholesterolemia    Hypertension    Past Surgical History:  Procedure Laterality Date   ABDOMINAL HYSTERECTOMY     CATARACT EXTRACTION     COLONOSCOPY WITH PROPOFOL N/A 03/16/2020   Procedure: COLONOSCOPY WITH PROPOFOL;  Surgeon: Toledo, Boykin Nearing, MD;  Location: [facility];  Service: Gastroenterology;  Laterality: N/A;   COLONOSCOPY WITH PROPOFOL N/A 09/26/2021   Procedure: COLONOSCOPY WITH PROPOFOL;  Surgeon: Regis Bill, MD;  Location: [facility];  Service: Endoscopy;  Laterality: N/A;   PARTIAL KNEE ARTHROPLASTY Left 01/23/2016   Procedure: UNICOMPARTMENTAL KNEE;  Surgeon: Christena Flake, MD;  Location: [facility];  Service: Orthopedics;  Laterality: Left;   FAMILY HISTORY Family History  Problem Relation Age of Onset   Diabetes Mother    Diabetes Daughter    Breast cancer Daughter 64   SOCIAL HISTORY Social History   Tobacco Use   Smoking status: Former    Packs/day: .5    Types: Cigarettes   Smokeless tobacco: Never  Vaping Use  Vaping Use: Never used  Substance Use Topics   Alcohol use: No   Drug use: No       OPHTHALMIC EXAM:  Base Eye Exam     Visual Acuity (Snellen - Linear)       Right Left   Dist [place] 20/20 +2 20/20         Tonometry (Tonopen, 9:14 AM)       Right Left   Pressure 14 14         Pupils       Dark Light Shape React APD   Right 3 2 Round Brisk None   Left 3 2 Round Brisk None         Visual Fields       Left Right    Full Full         Extraocular Movement       Right Left    Full, Ortho Full, Ortho         Neuro/Psych      Oriented x3: Yes   Mood/Affect: Normal         Dilation     Both eyes: 1.0% Mydriacyl, 2.5% Phenylephrine @ 9:11 AM           Slit Lamp and Fundus Exam     Slit Lamp Exam       Right Left   Lids/Lashes Dermatochalasis - upper lid Dermatochalasis - upper lid   Conjunctiva/Sclera White and quiet White and quiet   Cornea tear film debris, well healed cataract wound tear film debris, well healed cataract wound   Anterior Chamber Deep and quiet Deep and quiet   Iris Round and dilated Round and dilated   Lens PC IOL in good position PC IOL in good position   Anterior Vitreous Vitreous syneresis Vitreous syneresis         Fundus Exam       Right Left   Disc Pink and Sharp, Compact Pink and Sharp, Compact   C/D Ratio 0.1 0.1   Macula Flat, Good foveal reflex, RPE mottling, No heme or edema Flat, Good foveal reflex, RPE mottling, No heme or edema   Vessels attenuated, Tortuous attenuated, Tortuous   Periphery Attached, focal PED w/  resolved SRH at 1200, no RT/RD, no new lesions Attached, mild reticular degeneration, 2 focal SRH (temporal one larger) at 0130 midzone-- improved to focal pigment clumps, no heme; nasal one essentially resolved--focal pigment clump, temporal one with persistent fibrotic SRH--improving, no RT/RD, no new lesions           IMAGING AND PROCEDURES  Imaging and Procedures for 05/18/2023  OCT, Retina - OU - Both Eyes       Right Eye Quality was good. Central Foveal Thickness: 274. Progression has been stable. Findings include normal foveal contour, no IRF, no SRF, vitreomacular adhesion (PED superior periphery -- caught on high density widefield -- stable PED size and stable improvement in SRF on nasal border).   Left Eye Quality was good. Central Foveal Thickness: 304. Progression has been stable. Findings include normal foveal contour, no IRF, no SRF, pigment epithelial detachment, vitreomacular adhesion (Small, focal PED IN to fovea, interval  improvement in two peripheral PED's /SRHM--ST midzone caught on high density widefield).   Notes *Images captured and stored on drive  Diagnosis / Impression:  NFP; no IRF/SRF centrally OU OD: PED superior periphery -- caught on high density widefield -- stably collapsed and without fluid OS: Small, focal PED IN to  fovea, interval improvement in two peripheral PED's /SRHM--ST midzone caught on high density widefield  Clinical management:  See below  Abbreviations: NFP - Normal foveal profile. CME - cystoid macular edema. PED - pigment epithelial detachment. IRF - intraretinal fluid. SRF - subretinal fluid. EZ - ellipsoid zone. ERM - epiretinal membrane. ORA - outer retinal atrophy. ORT - outer retinal tubulation. SRHM - subretinal hyper-reflective material. IRHM - intraretinal hyper-reflective material      Intravitreal Injection, Pharmacologic Agent - OS - Left Eye       Time Out 05/18/2023. 9:59 AM. Confirmed correct patient, procedure, site, and patient consented.   Anesthesia No anesthesia was used. Anesthetic medications included Lidocaine 2%, Proparacaine 0.5%.   Procedure Preparation included 5% betadine to ocular surface, eyelid speculum. A (32g) needle was used.   Injection: 1.25 mg Bevacizumab 1.25mg /0.53ml   Route: Intravitreal, Site: Left Eye   NDC: [id number], Lot: [id number], Expiration date: 07/22/2023   Post-op Post injection exam found visual acuity of at least counting fingers. The patient tolerated the procedure well. There were no complications. The patient received written and verbal post procedure care education. Post injection medications were not given.            ASSESSMENT/PLAN:    ICD-10-CM   1. Exudative age-related macular degeneration of right eye with active choroidal neovascularization (HCC)  H35.3211 OCT, Retina - OU - Both Eyes    Intravitreal Injection, Pharmacologic Agent - OS - Left Eye    Bevacizumab (AVASTIN) SOLN 1.25 mg    2.  Exudative age-related macular degeneration of left eye with active choroidal neovascularization (HCC)  H35.3221 OCT, Retina - OU - Both Eyes    3. 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 Essential hypertension  I10     5. Hypertensive retinopathy of both eyes  H35.033     6. Pseudophakia, both eyes  Z96.1      1.  Exudative age related macular degeneration, OD - s/p IVA OD #1 (09.15.23), #2 (10.16.23), #3 (11.14.23), #4 (12.12.23), #5 (01.16.24), #6 (02.27.24) - interval increase in peripheral PED size and development of focal SRF superior periphery on 09.11.23 visit  - BCVA 20/25-3--slightly worse - OCT shows OD: PED superior periphery -- caught on high density widefield -- stably collapsed and without fluid at 12 weeks since last injxn  - exam without active heme or fluid surrounding PED / CNV  - recommend holding injection OD again today - pt in agreement - IVA informed consent obtained and signed, 01.16.24 (OU)  - f/u in 8 wks -- DFE/OCT, possible injection  2. Exudative age related macular degeneration, left eye  - s/p IVA OS #1 (01.16.24) for focal peripheral subretinal hemorrhage, #2 (02.27.24), #3 (04.09.24) - exam shows interval improvement in subretinal hemorrhage superior periphery adjacent to focal PED -- turning white and shrinking - OCT shows Small, focal PED IN to fovea, interval improvement in two peripheral PED's /SRHM--ST midzone caught on high density widefield at 6 wks  - recommend IVA OS #4 today, 05.21.24 w/ ext f/u to 8 wks  - pt in agreement  - RBA of procedure discussed, questions answered - informed consent obtained and signed - see procedure note - f/u in 8 wks, DFE, OCT, possible injxn  3. PED OU  - exam shows focal PEDs w/ +subretinal heme OU  - OD 1200 equator  - OS 0130 midzone - OCT shows OD: PED superior periphery -- caught on high density  widefield -- stably collapsed and without fluid; OS: Small, focal PED IN to fovea,  interval improvement in two peripheral PED's /SRHM--ST midzone caught on high density widefield  - BCVA 20/25-3 OD, 20/20 OS -- asymptomatic  - no retinal tear or retinal detachment  - discussed findings, prognosis - unclear etiology of focal PEDs -- differential includes ARMD variant, PEHCR, PCV  - recommend IVA OS #4 today, 05.21.24 as above  - f/u 8 weeks DFE, high density OCT (OD 1200, OS 0130)  4,5. Hypertensive retinopathy OU - discussed importance of tight BP control - monitor  6. Pseudophakia OU  - s/p CE/IOL OU  - IOLs in good position  - monitor  Ophthalmic Meds Ordered this visit:  Meds ordered this encounter  Medications   Bevacizumab (AVASTIN) SOLN 1.25 mg     Return in about 8 weeks (around 07/13/2023) for f/u Ex, AMD OU, DFE, OCT/ hign density OCT, Possible injections.  There are no Patient Instructions on file for this visit.   Explained the diagnoses, plan, and follow up with the patient and they expressed understanding.  Patient expressed understanding of the importance of proper follow up care.   This document serves as a record of services personally performed by Karie Chimera, MD, PhD. It was created on their behalf by Gerilyn Nestle, COT an ophthalmic technician. The creation of this record is the provider's dictation and/or activities during the visit.    Electronically signed by:  Gerilyn Nestle, COT  05.21.24 12:44 PM  Karie Chimera, M.D., Ph.D. Diseases & Surgery of the Retina and Vitreous [facility]  I have reviewed the above documentation for accuracy and completeness, and I agree with the above. Karie Chimera, M.D., Ph.D. 05/18/23 12:44 PM  Abbreviations: M myopia (nearsighted); A astigmatism; H hyperopia (farsighted); P presbyopia; Mrx spectacle prescription;  CTL contact lenses; OD right eye; OS left eye; OU both eyes  XT exotropia; ET esotropia; PEK punctate epithelial keratitis; PEE punctate epithelial  erosions; DES dry eye syndrome; MGD meibomian gland dysfunction; ATs artificial tears; PFAT's preservative free artificial tears; NSC nuclear sclerotic cataract; PSC posterior subcapsular cataract; ERM epi-retinal membrane; PVD posterior vitreous detachment; RD retinal detachment; DM diabetes mellitus; DR diabetic retinopathy; NPDR non-proliferative diabetic retinopathy; PDR proliferative diabetic retinopathy; CSME clinically significant macular edema; DME diabetic macular edema; dbh dot blot hemorrhages; CWS cotton wool spot; POAG primary open angle glaucoma; C/D cup-to-disc ratio; HVF humphrey visual field; GVF goldmann visual field; OCT optical coherence tomography; IOP intraocular pressure; BRVO Branch retinal vein occlusion; CRVO central retinal vein occlusion; CRAO central retinal artery occlusion; BRAO branch retinal artery occlusion; RT retinal tear; SB scleral buckle; PPV pars plana vitrectomy; VH Vitreous hemorrhage; PRP panretinal laser photocoagulation; IVK intravitreal kenalog; VMT vitreomacular traction; MH Macular hole;  NVD neovascularization of the disc; NVE neovascularization elsewhere; AREDS age related eye disease study; ARMD age related macular degeneration; POAG primary open angle glaucoma; EBMD epithelial/anterior basement membrane dystrophy; ACIOL anterior chamber intraocular lens; IOL intraocular lens; PCIOL posterior chamber intraocular lens; Phaco/IOL phacoemulsification with intraocular lens placement; PRK photorefractive keratectomy; LASIK laser assisted in situ keratomileusis; HTN hypertension; DM diabetes mellitus; COPD chronic obstructive pulmonary disease

## 2023-05-18 ENCOUNTER — Ambulatory Visit (INDEPENDENT_AMBULATORY_CARE_PROVIDER_SITE_OTHER): Payer: Medicare HMO | Admitting: Ophthalmology

## 2023-05-18 ENCOUNTER — Encounter (INDEPENDENT_AMBULATORY_CARE_PROVIDER_SITE_OTHER): Payer: Self-pay | Admitting: Ophthalmology

## 2023-05-18 DIAGNOSIS — H35033 Hypertensive retinopathy, bilateral: Secondary | ICD-10-CM

## 2023-05-18 DIAGNOSIS — H35723 Serous detachment of retinal pigment epithelium, bilateral: Secondary | ICD-10-CM

## 2023-05-18 DIAGNOSIS — H353231 Exudative age-related macular degeneration, bilateral, with active choroidal neovascularization: Secondary | ICD-10-CM | POA: Diagnosis not present

## 2023-05-18 DIAGNOSIS — I10 Essential (primary) hypertension: Secondary | ICD-10-CM | POA: Diagnosis not present

## 2023-05-18 DIAGNOSIS — H353221 Exudative age-related macular degeneration, left eye, with active choroidal neovascularization: Secondary | ICD-10-CM

## 2023-05-18 DIAGNOSIS — H353211 Exudative age-related macular degeneration, right eye, with active choroidal neovascularization: Secondary | ICD-10-CM

## 2023-05-18 DIAGNOSIS — Z961 Presence of intraocular lens: Secondary | ICD-10-CM

## 2023-05-18 MED ORDER — BEVACIZUMAB CHEMO INJECTION 1.25MG/0.05ML SYRINGE FOR KALEIDOSCOPE
1.2500 mg | INTRAVITREAL | Status: AC | PRN
  Administered 2023-05-18: 1.25 mg via INTRAVITREAL

## 2023-06-28 DIAGNOSIS — Z885 Allergy status to narcotic agent status: Secondary | ICD-10-CM | POA: Diagnosis not present

## 2023-06-28 DIAGNOSIS — Z87891 Personal history of nicotine dependence: Secondary | ICD-10-CM | POA: Diagnosis not present

## 2023-06-28 DIAGNOSIS — K219 Gastro-esophageal reflux disease without esophagitis: Secondary | ICD-10-CM | POA: Diagnosis not present

## 2023-06-28 DIAGNOSIS — Z6832 Body mass index (BMI) 32.0-32.9, adult: Secondary | ICD-10-CM | POA: Diagnosis not present

## 2023-06-28 DIAGNOSIS — Z791 Long term (current) use of non-steroidal anti-inflammatories (NSAID): Secondary | ICD-10-CM | POA: Diagnosis not present

## 2023-06-28 DIAGNOSIS — E785 Hyperlipidemia, unspecified: Secondary | ICD-10-CM | POA: Diagnosis not present

## 2023-06-28 DIAGNOSIS — H353290 Exudative age-related macular degeneration, unspecified eye, stage unspecified: Secondary | ICD-10-CM | POA: Diagnosis not present

## 2023-06-28 DIAGNOSIS — I951 Orthostatic hypotension: Secondary | ICD-10-CM | POA: Diagnosis not present

## 2023-06-28 DIAGNOSIS — Z888 Allergy status to other drugs, medicaments and biological substances status: Secondary | ICD-10-CM | POA: Diagnosis not present

## 2023-06-28 DIAGNOSIS — Z8249 Family history of ischemic heart disease and other diseases of the circulatory system: Secondary | ICD-10-CM | POA: Diagnosis not present

## 2023-06-28 DIAGNOSIS — I10 Essential (primary) hypertension: Secondary | ICD-10-CM | POA: Diagnosis not present

## 2023-06-28 DIAGNOSIS — Z803 Family history of malignant neoplasm of breast: Secondary | ICD-10-CM | POA: Diagnosis not present

## 2023-06-30 NOTE — Progress Notes (Signed)
**Note De-Identified Marissa Obfuscation** [facility] - Clinic Note  07/13/2023     CHIEF COMPLAINT Patient presents for Retina Follow Up   HISTORY OF PRESENT ILLNESS: Marissa Mullen is a 82 y.o. female who presents to the clinic today for:   HPI     Retina Follow Up   Patient presents with  Wet AMD.  In both eyes.  This started 8 weeks ago.  I, the attending physician,  performed the HPI with the patient and updated documentation appropriately.        Comments   Patient here for 8 weeks retina follow up for exu ARMD OS. Patient states vision doing good. No eye pain. Using drops prn. More now hot weather.      Last edited by Rennis Chris, MD on 07/13/2023  4:39 PM.    Patient is pleased with vision.   Referring physician: Isla Pence, OD [address] [place],  [place] [address]  HISTORICAL INFORMATION:   Selected notes from the MEDICAL RECORD NUMBER Referred by Dr. Clydene Pugh for concern of operculated hole LEE:  Ocular Hx- PMH-    CURRENT MEDICATIONS: No current outpatient medications on file. (Ophthalmic Drugs)   No current facility-administered medications for this visit. (Ophthalmic Drugs)   Current Outpatient Medications (Other)  Medication Sig   acetaminophen (TYLENOL) 650 MG CR tablet Take 650-1,300 mg by mouth every 8 (eight) hours as needed for pain.    Calcium Carb-Cholecalciferol 600-800 MG-UNIT TABS Take 2 tablets by mouth daily.   Multiple Vitamins-Minerals (WOMENS MULTIVITAMIN PLUS) TABS Take 1 tablet by mouth daily.   pantoprazole (PROTONIX) 40 MG tablet Take 40 mg by mouth daily.   simvastatin (ZOCOR) 20 MG tablet Take 20 mg by mouth daily.   No current facility-administered medications for this visit. (Other)   REVIEW OF SYSTEMS: ROS   Positive for: Eyes Negative for: Constitutional, Gastrointestinal, Neurological, Skin, Genitourinary, Musculoskeletal, HENT, Endocrine, Cardiovascular, Respiratory, Psychiatric, Allergic/Imm, Heme/Lymph Last edited by Laddie Aquas, COA on 07/13/2023  9:08 AM.     ALLERGIES Allergies  Allergen Reactions   Ondansetron     Other reaction(s): Abdominal Pain   Oxycodone Itching   Tramadol Itching    Unknown    PAST MEDICAL HISTORY Past Medical History:  Diagnosis Date   Arthritis    Colon polyps    Diverticulosis    Esophageal stricture    GERD (gastroesophageal reflux disease)    Hepatitis    "Age 54"   Hypercholesterolemia    Hypertension    Past Surgical History:  Procedure Laterality Date   ABDOMINAL HYSTERECTOMY     CATARACT EXTRACTION     COLONOSCOPY WITH PROPOFOL N/A 03/16/2020   Procedure: COLONOSCOPY WITH PROPOFOL;  Surgeon: Toledo, Boykin Nearing, MD;  Location: [facility];  Service: Gastroenterology;  Laterality: N/A;   COLONOSCOPY WITH PROPOFOL N/A 09/26/2021   Procedure: COLONOSCOPY WITH PROPOFOL;  Surgeon: Regis Bill, MD;  Location: [facility] END[facility]COPY;  Service: Endoscopy;  Laterality: N/A;   PARTIAL KNEE ARTHROPLASTY Left 01/23/2016   Procedure: UNICOMPARTMENTAL KNEE;  Surgeon: Christena Flake, MD;  Location: [facility] ORS;  Service: Orthopedics;  Laterality: Left;   FAMILY HISTORY Family History  Problem Relation Age of Onset   Diabetes Mother    Diabetes Daughter    Breast cancer Daughter 14   SOCIAL HISTORY Social History   Tobacco Use   Smoking status: Former    Current packs/day: 0.50    Types: Cigarettes   Smokeless tobacco: Never  Vaping Use   Vaping status: Never Used  Substance Use Topics   Alcohol use: No   Drug use: No       OPHTHALMIC EXAM:  Base Eye Exam     Visual Acuity (Snellen - Linear)       Right Left   Dist Curtis 20/20 -1 20/20         Tonometry (Tonopen, 9:06 AM)       Right Left   Pressure 14 09         Pupils       Dark Light Shape React APD   Right 3 2 Round Brisk None   Left 3 2 Round Brisk None         Visual Fields (Counting fingers)       Left Right    Full Full         Extraocular Movement       Right Left     Full, Ortho Full, Ortho         Neuro/Psych     Oriented x3: Yes   Mood/Affect: Normal         Dilation     Both eyes: 1.0% Mydriacyl, 2.5% Phenylephrine @ 9:05 AM           Slit Lamp and Fundus Exam     Slit Lamp Exam       Right Left   Lids/Lashes Dermatochalasis - upper lid Dermatochalasis - upper lid   Conjunctiva/Sclera White and quiet White and quiet   Cornea tear film debris, well healed cataract wound tear film debris, well healed cataract wound   Anterior Chamber Deep and quiet Deep and quiet   Iris Round and dilated Round and dilated   Lens PC IOL in good position PC IOL in good position   Anterior Vitreous Vitreous syneresis Vitreous syneresis         Fundus Exam       Right Left   Disc Pink and Sharp, Compact Pink and Sharp, Compact   C/D Ratio 0.1 0.1   Macula Flat, Good foveal reflex, RPE mottling, No heme or edema Flat, Good foveal reflex, RPE mottling, No heme or edema   Vessels attenuated, Tortuous attenuated, Tortuous   Periphery Attached, focal PED w/  resolved SRH at 1200, no RT/RD, no new lesions Attached, mild reticular degeneration, 2 focal SRH (temporal one larger) at 0130 midzone -- stably improved to focal pigment clumps, no heme; nasal one essentially resolved--focal pigment clump, no RT/RD, no new lesions           IMAGING AND PROCEDURES  Imaging and Procedures for 07/13/2023  OCT, Retina - OU - Both Eyes       Right Eye Quality was good. Central Foveal Thickness: 276. Progression has been stable. Findings include normal foveal contour, no IRF, no SRF, vitreomacular adhesion (PED superior periphery -- caught on high density widefield -- stable PED size and stable improvement in SRF on nasal border).   Left Eye Quality was good. Central Foveal Thickness: 310. Progression has been stable. Findings include normal foveal contour, no IRF, no SRF, pigment epithelial detachment, vitreomacular adhesion (Small, focal PED IN to fovea,  stable improvement in two peripheral PED's /SRHM -- ST midzone caught on high density widefield).   Notes *Images captured and stored on drive  Diagnosis / Impression:  NFP; no IRF/SRF centrally OU OD: PED superior periphery -- caught on high density widefield -- stably collapsed and without fluid OS: Small,  focal PED IN to fovea, stable improvement in two peripheral PED's /SRHM -- ST midzone caught on high density widefield  Clinical management:  See below  Abbreviations: NFP - Normal foveal profile. CME - cystoid macular edema. PED - pigment epithelial detachment. IRF - intraretinal fluid. SRF - subretinal fluid. EZ - ellipsoid zone. ERM - epiretinal membrane. ORA - outer retinal atrophy. ORT - outer retinal tubulation. SRHM - subretinal hyper-reflective material. IRHM - intraretinal hyper-reflective material      Intravitreal Injection, Pharmacologic Agent - OS - Left Eye       Time Out 07/13/2023. 10:28 AM. Confirmed correct patient, procedure, site, and patient consented.   Anesthesia No anesthesia was used. Anesthetic medications included Lidocaine 2%, Proparacaine 0.5%.   Procedure Preparation included 5% betadine to ocular surface, eyelid speculum. A (32g) needle was used.   Injection: 1.25 mg Bevacizumab 1.25mg /0.64ml   Route: Intravitreal, Site: Left Eye   NDC: [id number], Lot: [id number], Expiration date: 10/04/2023   Post-op Post injection exam found visual acuity of at least counting fingers. The patient tolerated the procedure well. There were no complications. The patient received written and verbal post procedure care education. Post injection medications were not given.            ASSESSMENT/PLAN:    ICD-10-CM   1. Exudative age-related macular degeneration of right eye with active choroidal neovascularization (HCC)  H35.3211 OCT, Retina - OU - Both Eyes    2. Exudative age-related macular degeneration of left eye with active choroidal neovascularization  (HCC)  H35.3221 OCT, Retina - OU - Both Eyes    Intravitreal Injection, Pharmacologic Agent - OS - Left Eye    Bevacizumab (AVASTIN) SOLN 1.25 mg    3. 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 Essential hypertension  I10     5. Hypertensive retinopathy of both eyes  H35.033     6. Pseudophakia, both eyes  Z96.1      1.  Exudative age related macular degeneration, OD - s/p IVA OD #1 (09.15.23), #2 (10.16.23), #3 (11.14.23), #4 (12.12.23), #5 (01.16.24), #6 (02.27.24) - interval increase in peripheral PED size and development of focal SRF superior periphery on 09.11.23 visit  - BCVA 20/20 -- stable - OCT shows OD: PED superior periphery -- caught on high density widefield -- stably collapsed and without fluid at 5 months since last injxn  - exam without active heme or fluid surrounding PED / CNV  - recommend holding injection OD again today - pt in agreement - IVA informed consent obtained and signed, 01.16.24 (OU)  - f/u in 12 wks -- DFE/OCT, possible injection  2. Exudative age related macular degeneration, left eye  - s/p IVA OS #1 (01.16.24) for focal peripheral subretinal hemorrhage, #2 (02.27.24), #3 (04.09.24), #4 (05.21.24) - exam shows interval improvement in subretinal hemorrhage superior periphery adjacent to focal PED -- turning white and shrinking - OCT shows Small, focal PED IN to fovea, stable improvement in two peripheral PED's /SRHM--ST midzone caught on high density widefield at 8 wks  - recommend IVA OS #5 today, 07.16.24 w/ ext f/u to 12 wks  - pt in agreement  - RBA of procedure discussed, questions answered - informed consent obtained and signed - see procedure note - f/u in 12 wks, DFE, OCT, possible injxn  3. PED OU  - exam shows focal PEDs w/ +subretinal heme OU  - OD 1200 equator  - OS 0130 midzone - OCT shows  OD: PED superior periphery -- caught on high density widefield -- stably collapsed and without fluid; OS: Small, focal  PED IN to fovea, stable improvement in two peripheral PED's /SRHM--ST midzone caught on high density widefield  - BCVA 20/20 OU -- asymptomatic  - no retinal tear or retinal detachment  - discussed findings, prognosis - unclear etiology of focal PEDs -- differential includes ARMD variant, PEHCR, PCV  - recommend IVA OS #5 today see above  - f/u 12 weeks DFE, high density OCT (OD 1200, OS 0130)  4,5. Hypertensive retinopathy OU - discussed importance of tight BP control - monitor  6. Pseudophakia OU  - s/p CE/IOL OU  - IOLs in good position  - monitor  Ophthalmic Meds Ordered this visit:  Meds ordered this encounter  Medications   Bevacizumab (AVASTIN) SOLN 1.25 mg     Return in about 12 weeks (around 10/05/2023) for f/u exu ARMD OU, DFE, OCT.  There are no Patient Instructions on file for this visit.   Explained the diagnoses, plan, and follow up with the patient and they expressed understanding.  Patient expressed understanding of the importance of proper follow up care.   This document serves as a record of services personally performed by Karie Chimera, MD, PhD. It was created on their behalf by Gerilyn Nestle, COT an ophthalmic technician. The creation of this record is the provider's dictation and/or activities during the visit.    Electronically signed by:  Charlette Caffey, COT  07/13/23 4:40 PM  This document serves as a record of services personally performed by Karie Chimera, MD, PhD. It was created on their behalf by Glee Arvin. Manson Passey, OA an ophthalmic technician. The creation of this record is the provider's dictation and/or activities during the visit.    Electronically signed by: Glee Arvin. Manson Passey, OA 07/13/23 4:40 PM  Karie Chimera, M.D., Ph.D. Diseases & Surgery of the Retina and Vitreous [facility]  I have reviewed the above documentation for accuracy and completeness, and I agree with the above. Karie Chimera, M.D., Ph.D.  07/13/23 4:52 PM  Abbreviations: M myopia (nearsighted); A astigmatism; H hyperopia (farsighted); P presbyopia; Mrx spectacle prescription;  CTL contact lenses; OD right eye; OS left eye; OU both eyes  XT exotropia; ET esotropia; PEK punctate epithelial keratitis; PEE punctate epithelial erosions; DES dry eye syndrome; MGD meibomian gland dysfunction; ATs artificial tears; PFAT's preservative free artificial tears; NSC nuclear sclerotic cataract; PSC posterior subcapsular cataract; ERM epi-retinal membrane; PVD posterior vitreous detachment; RD retinal detachment; DM diabetes mellitus; DR diabetic retinopathy; NPDR non-proliferative diabetic retinopathy; PDR proliferative diabetic retinopathy; CSME clinically significant macular edema; DME diabetic macular edema; dbh dot blot hemorrhages; CWS cotton wool spot; POAG primary open angle glaucoma; C/D cup-to-disc ratio; HVF humphrey visual field; GVF goldmann visual field; OCT optical coherence tomography; IOP intraocular pressure; BRVO Branch retinal vein occlusion; CRVO central retinal vein occlusion; CRAO central retinal artery occlusion; BRAO branch retinal artery occlusion; RT retinal tear; SB scleral buckle; PPV pars plana vitrectomy; VH Vitreous hemorrhage; PRP panretinal laser photocoagulation; IVK intravitreal kenalog; VMT vitreomacular traction; MH Macular hole;  NVD neovascularization of the disc; NVE neovascularization elsewhere; AREDS age related eye disease study; ARMD age related macular degeneration; POAG primary open angle glaucoma; EBMD epithelial/anterior basement membrane dystrophy; ACIOL anterior chamber intraocular lens; IOL intraocular lens; PCIOL posterior chamber intraocular lens; Phaco/IOL phacoemulsification with intraocular lens placement; PRK photorefractive keratectomy; LASIK laser assisted in situ keratomileusis; HTN hypertension;  DM diabetes mellitus; COPD chronic obstructive pulmonary disease

## 2023-07-13 ENCOUNTER — Encounter (INDEPENDENT_AMBULATORY_CARE_PROVIDER_SITE_OTHER): Payer: Self-pay | Admitting: Ophthalmology

## 2023-07-13 ENCOUNTER — Ambulatory Visit (INDEPENDENT_AMBULATORY_CARE_PROVIDER_SITE_OTHER): Payer: Medicare HMO | Admitting: Ophthalmology

## 2023-07-13 DIAGNOSIS — Z961 Presence of intraocular lens: Secondary | ICD-10-CM | POA: Diagnosis not present

## 2023-07-13 DIAGNOSIS — H353231 Exudative age-related macular degeneration, bilateral, with active choroidal neovascularization: Secondary | ICD-10-CM | POA: Diagnosis not present

## 2023-07-13 DIAGNOSIS — I10 Essential (primary) hypertension: Secondary | ICD-10-CM

## 2023-07-13 DIAGNOSIS — H35033 Hypertensive retinopathy, bilateral: Secondary | ICD-10-CM | POA: Diagnosis not present

## 2023-07-13 DIAGNOSIS — H35723 Serous detachment of retinal pigment epithelium, bilateral: Secondary | ICD-10-CM

## 2023-07-13 DIAGNOSIS — H353211 Exudative age-related macular degeneration, right eye, with active choroidal neovascularization: Secondary | ICD-10-CM

## 2023-07-13 DIAGNOSIS — H353221 Exudative age-related macular degeneration, left eye, with active choroidal neovascularization: Secondary | ICD-10-CM

## 2023-07-13 MED ORDER — BEVACIZUMAB CHEMO INJECTION 1.25MG/0.05ML SYRINGE FOR KALEIDOSCOPE
1.2500 mg | INTRAVITREAL | Status: AC | PRN
  Administered 2023-07-13: 1.25 mg via INTRAVITREAL

## 2023-07-28 DIAGNOSIS — H33322 Round hole, left eye: Secondary | ICD-10-CM | POA: Diagnosis not present

## 2023-07-28 DIAGNOSIS — H353131 Nonexudative age-related macular degeneration, bilateral, early dry stage: Secondary | ICD-10-CM | POA: Diagnosis not present

## 2023-07-28 DIAGNOSIS — H26493 Other secondary cataract, bilateral: Secondary | ICD-10-CM | POA: Diagnosis not present

## 2023-09-02 DIAGNOSIS — M79604 Pain in right leg: Secondary | ICD-10-CM | POA: Diagnosis not present

## 2023-09-02 DIAGNOSIS — R600 Localized edema: Secondary | ICD-10-CM | POA: Diagnosis not present

## 2023-09-06 DIAGNOSIS — M79671 Pain in right foot: Secondary | ICD-10-CM | POA: Diagnosis not present

## 2023-09-06 DIAGNOSIS — M79604 Pain in right leg: Secondary | ICD-10-CM | POA: Diagnosis not present

## 2023-09-10 ENCOUNTER — Other Ambulatory Visit: Payer: Self-pay | Admitting: Family Medicine

## 2023-09-10 DIAGNOSIS — R600 Localized edema: Secondary | ICD-10-CM

## 2023-09-10 DIAGNOSIS — M79604 Pain in right leg: Secondary | ICD-10-CM

## 2023-09-14 ENCOUNTER — Ambulatory Visit
Admission: RE | Admit: 2023-09-14 | Discharge: 2023-09-14 | Disposition: A | Discharge status: Home / Self Care | Payer: Medicare HMO | Source: Ambulatory Visit | Attending: Family Medicine | Admitting: Family Medicine

## 2023-09-14 DIAGNOSIS — M79604 Pain in right leg: Secondary | ICD-10-CM | POA: Diagnosis not present

## 2023-09-14 DIAGNOSIS — R600 Localized edema: Secondary | ICD-10-CM | POA: Insufficient documentation

## 2023-09-21 ENCOUNTER — Other Ambulatory Visit: Payer: Self-pay | Admitting: Family Medicine

## 2023-09-21 DIAGNOSIS — Z1231 Encounter for screening mammogram for malignant neoplasm of breast: Secondary | ICD-10-CM

## 2023-09-21 NOTE — Progress Notes (Shared)
[facility] - Clinic Note  10/05/2023     CHIEF COMPLAINT Patient presents for Retina Follow Up   HISTORY OF PRESENT ILLNESS: Marissa Mullen is a 82 y.o. female who presents to the clinic today for:   HPI     Retina Follow Up   Patient presents with  Wet AMD.  In both eyes.  This started months ago.  Duration of 12 weeks.  I, the attending physician,  performed the HPI with the patient and updated documentation appropriately.        Comments   Patient states the vision is the same. She is using AT's OU PRN.       Last edited by Rennis Chris, MD on 10/07/2023  8:16 PM.     Referring physician: Isla Pence, OD [address] [place],  [place] [address]  HISTORICAL INFORMATION:   Selected notes from the MEDICAL RECORD NUMBER Referred by Dr. Clydene Pugh for concern of operculated hole LEE:  Ocular Hx- PMH-    CURRENT MEDICATIONS: No current outpatient medications on file. (Ophthalmic Drugs)   No current facility-administered medications for this visit. (Ophthalmic Drugs)   Current Outpatient Medications (Other)  Medication Sig   acetaminophen (TYLENOL) 650 MG CR tablet Take 650-1,300 mg by mouth every 8 (eight) hours as needed for pain.    Calcium Carb-Cholecalciferol 600-800 MG-UNIT TABS Take 2 tablets by mouth daily.   Multiple Vitamins-Minerals (WOMENS MULTIVITAMIN PLUS) TABS Take 1 tablet by mouth daily.   pantoprazole (PROTONIX) 40 MG tablet Take 40 mg by mouth daily.   simvastatin (ZOCOR) 20 MG tablet Take 20 mg by mouth daily.   No current facility-administered medications for this visit. (Other)   REVIEW OF SYSTEMS: ROS   Positive for: Eyes Negative for: Constitutional, Gastrointestinal, Neurological, Skin, Genitourinary, Musculoskeletal, HENT, Endocrine, Cardiovascular, Respiratory, Psychiatric, Allergic/Imm, Heme/Lymph Last edited by Charlette Caffey, COT on 10/05/2023  9:05 AM.      ALLERGIES Allergies  Allergen Reactions    Ondansetron     Other reaction(s): Abdominal Pain   Oxycodone Itching   Tramadol Itching    Unknown    PAST MEDICAL HISTORY Past Medical History:  Diagnosis Date   Arthritis    Colon polyps    Diverticulosis    Esophageal stricture    GERD (gastroesophageal reflux disease)    Hepatitis    "Age 89"   Hypercholesterolemia    Hypertension    Past Surgical History:  Procedure Laterality Date   ABDOMINAL HYSTERECTOMY     CATARACT EXTRACTION     COLONOSCOPY WITH PROPOFOL N/A 03/16/2020   Procedure: COLONOSCOPY WITH PROPOFOL;  Surgeon: Toledo, Boykin Nearing, MD;  Location: [facility];  Service: Gastroenterology;  Laterality: N/A;   COLONOSCOPY WITH PROPOFOL N/A 09/26/2021   Procedure: COLONOSCOPY WITH PROPOFOL;  Surgeon: Regis Bill, MD;  Location: [facility];  Service: Endoscopy;  Laterality: N/A;   PARTIAL KNEE ARTHROPLASTY Left 01/23/2016   Procedure: UNICOMPARTMENTAL KNEE;  Surgeon: Christena Flake, MD;  Location: [facility] OR[facility];  Service: Orthopedics;  Laterality: Left;   FAMILY HISTORY Family History  Problem Relation Age of Onset   Diabetes Mother    Diabetes Daughter    Breast cancer Daughter 64   SOCIAL HISTORY Social History   Tobacco Use   Smoking status: Former    Current packs/day: 0.50    Types: Cigarettes   Smokeless tobacco: Never  Vaping Use   Vaping status: Never Used  Substance Use Topics  Alcohol use: No   Drug use: No       OPHTHALMIC EXAM:  Base Eye Exam     Visual Acuity (Snellen - Linear)       Right Left   Dist [place]  20/20   Near [place] J1+   [place]        Tonometry (Tonopen, 9:13 AM)       Right Left   Pressure 12 12         Pupils       Dark Light Shape React APD   Right 3 2 Round Brisk None   Left 3 2 Round Brisk None         Visual Fields       Left Right    Full Full         Extraocular Movement       Right Left    Full, Ortho Full, Ortho         Neuro/Psych     Oriented x3: Yes    Mood/Affect: Normal         Dilation     Both eyes: 1.0% Mydriacyl, 2.5% Phenylephrine @ 9:06 AM           Slit Lamp and Fundus Exam     Slit Lamp Exam       Right Left   Lids/Lashes Dermatochalasis - upper lid Dermatochalasis - upper lid   Conjunctiva/Sclera White and quiet White and quiet   Cornea tear film debris, well healed cataract wound tear film debris, well healed cataract wound   Anterior Chamber Deep and quiet Deep and quiet   Iris Round and dilated Round and dilated   Lens PC IOL in good position PC IOL in good position   Anterior Vitreous Vitreous syneresis Vitreous syneresis         Fundus Exam       Right Left   Disc Pink and Sharp, Compact Pink and Sharp, Compact   C/D Ratio 0.1 0.1   Macula Flat, Good foveal reflex, RPE mottling, No heme or edema Flat, Good foveal reflex, RPE mottling, No heme or edema   Vessels attenuated, Tortuous attenuated, Tortuous   Periphery Attached, focal PED w/ resolved SRH at 1200, no RT/RD, no new lesions Attached, mild reticular degeneration, 2 focal SRH (temporal one larger) at 0130 midzone -- stably improved to focal pigment clumps, no heme; no RT/RD, no new lesions           IMAGING AND PROCEDURES  Imaging and Procedures for 10/05/2023  OCT, Retina - OU - Both Eyes       Right Eye Quality was good. Central Foveal Thickness: 278. Progression has been stable. Findings include normal foveal contour, no IRF, no SRF, vitreomacular adhesion (PED superior periphery -- caught on high density widefield -- stable PED size and stable improvement in SRF on nasal border).   Left Eye Quality was good. Central Foveal Thickness: 309. Progression has been stable. Findings include normal foveal contour, no IRF, no SRF, pigment epithelial detachment, vitreomacular adhesion (Small, focal PED IN to fovea, stable improvement in two peripheral PED's /SRHM -- ST midzone caught on high density widefield).   Notes *Images captured and  stored on drive  Diagnosis / Impression:  NFP; no IRF/SRF centrally OU OD: PED superior periphery -- caught on high density widefield -- stably collapsed and without fluid OS: Small, focal PED IN to fovea, stable improvement in two peripheral PED's /SRHM -- ST midzone caught on  high density widefield  Clinical management:  See below  Abbreviations: NFP - Normal foveal profile. CME - cystoid macular edema. PED - pigment epithelial detachment. IRF - intraretinal fluid. SRF - subretinal fluid. EZ - ellipsoid zone. ERM - epiretinal membrane. ORA - outer retinal atrophy. ORT - outer retinal tubulation. SRHM - subretinal hyper-reflective material. IRHM - intraretinal hyper-reflective material            ASSESSMENT/PLAN:    ICD-10-CM   1. Exudative age-related macular degeneration of right eye with active choroidal neovascularization (HCC)  H35.3211 OCT, Retina - OU - Both Eyes    2. Exudative age-related macular degeneration of left eye with active choroidal neovascularization (HCC)  H35.3221     3. 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 Essential hypertension  I10     5. Hypertensive retinopathy of both eyes  H35.033     6. Pseudophakia, both eyes  Z96.1      1.  Exudative age related macular degeneration, OD - s/p IVA OD #1 (09.15.23), #2 (10.16.23), #3 (11.14.23), #4 (12.12.23), #5 (01.16.24), #6 (02.27.24) - interval increase in peripheral PED size and development of focal SRF superior periphery on 09.11.23 visit  - BCVA 20/20 -- stable - OCT shows OD: PED superior periphery -- caught on high density widefield -- stably collapsed and without fluid at 7+ months since last injxn  - exam without active heme or fluid surrounding PED / CNV  - recommend holding injection OD again today - pt in agreement - IVA informed consent obtained and signed, 01.16.24 (OU)  - f/u in 12 wks -- DFE/OCT, possible injection  2. Exudative age related macular  degeneration, left eye  - s/p IVA OS #1 (01.16.24) for focal peripheral subretinal hemorrhage, #2 (02.27.24), #3 (04.09.24), #4 (05.21.24), #5 (07.16.24) - exam shows stable improvement in subretinal hemorrhage superior periphery adjacent to focal PED -- turning white and shrinking -- essentially resolved - OCT shows Small, focal PED IN to fovea, stable improvement in two peripheral PED's /SRHM--ST midzone caught on high density widefield at 12 wks  - recommend holding injection OS today  - pt in agreement  - RBA of procedure discussed, questions answered - informed consent obtained and signed - see procedure note - f/u in 12 wks, DFE, OCT, possible injxn  3. PED OU  - exam shows focal PEDs w/ +subretinal heme OU  - OD 1200 equator  - OS 0130 midzone - OCT shows OD: PED superior periphery -- caught on high density widefield -- stably collapsed and without fluid; OS: Small, focal PED IN to fovea, stable improvement in two peripheral PED's /SRHM--ST midzone caught on high density widefield  - BCVA 20/20 OU -- asymptomatic  - no retinal tear or retinal detachment  - discussed findings, prognosis - unclear etiology of focal PEDs -- differential includes ARMD variant, PEHCR, PCV  - f/u 12 weeks DFE, high density OCT (OD 1200, OS 0130)  4,5. Hypertensive retinopathy OU - discussed importance of tight BP control - monitor  6. Pseudophakia OU  - s/p CE/IOL OU  - IOLs in good position  - monitor  Ophthalmic Meds Ordered this visit:  No orders of the defined types were placed in this encounter.    Return in about 12 weeks (around 12/28/2023) for f/u Ex AMD OU, DFE, OCT, Possible, IVA, OU.  There are no Patient Instructions on file for this visit.   Explained the diagnoses, plan, and follow up  with the patient and they expressed understanding.  Patient expressed understanding of the importance of proper follow up care.   This document serves as a record of services personally performed  by Karie Chimera, MD, PhD. It was created on their behalf by Charlette Caffey, COT an ophthalmic technician. The creation of this record is the provider's dictation and/or activities during the visit.    Electronically signed by:  Charlette Caffey, COT  10/07/23 8:22 PM  Karie Chimera, M.D., Ph.D. Diseases & Surgery of the Retina and Vitreous [facility]  I have reviewed the above documentation for accuracy and completeness, and I agree with the above. Karie Chimera, M.D., Ph.D. 10/07/23 8:22 PM  Abbreviations: M myopia (nearsighted); A astigmatism; H hyperopia (farsighted); P presbyopia; Mrx spectacle prescription;  CTL contact lenses; OD right eye; OS left eye; OU both eyes  XT exotropia; ET esotropia; PEK punctate epithelial keratitis; PEE punctate epithelial erosions; DES dry eye syndrome; MGD meibomian gland dysfunction; ATs artificial tears; PFAT's preservative free artificial tears; NSC nuclear sclerotic cataract; PSC posterior subcapsular cataract; ERM epi-retinal membrane; PVD posterior vitreous detachment; RD retinal detachment; DM diabetes mellitus; DR diabetic retinopathy; NPDR non-proliferative diabetic retinopathy; PDR proliferative diabetic retinopathy; CSME clinically significant macular edema; DME diabetic macular edema; dbh dot blot hemorrhages; CWS cotton wool spot; POAG primary open angle glaucoma; C/D cup-to-disc ratio; HVF humphrey visual field; GVF goldmann visual field; OCT optical coherence tomography; IOP intraocular pressure; BRVO Branch retinal vein occlusion; CRVO central retinal vein occlusion; CRAO central retinal artery occlusion; BRAO branch retinal artery occlusion; RT retinal tear; SB scleral buckle; PPV pars plana vitrectomy; VH Vitreous hemorrhage; PRP panretinal laser photocoagulation; IVK intravitreal kenalog; VMT vitreomacular traction; MH Macular hole;  NVD neovascularization of the disc; NVE neovascularization elsewhere; AREDS  age related eye disease study; ARMD age related macular degeneration; POAG primary open angle glaucoma; EBMD epithelial/anterior basement membrane dystrophy; ACIOL anterior chamber intraocular lens; IOL intraocular lens; PCIOL posterior chamber intraocular lens; Phaco/IOL phacoemulsification with intraocular lens placement; PRK photorefractive keratectomy; LASIK laser assisted in situ keratomileusis; HTN hypertension; DM diabetes mellitus; COPD chronic obstructive pulmonary disease

## 2023-10-05 ENCOUNTER — Encounter (INDEPENDENT_AMBULATORY_CARE_PROVIDER_SITE_OTHER): Payer: Self-pay | Admitting: Ophthalmology

## 2023-10-05 ENCOUNTER — Ambulatory Visit (INDEPENDENT_AMBULATORY_CARE_PROVIDER_SITE_OTHER): Payer: Medicare HMO | Admitting: Ophthalmology

## 2023-10-05 DIAGNOSIS — H353211 Exudative age-related macular degeneration, right eye, with active choroidal neovascularization: Secondary | ICD-10-CM

## 2023-10-05 DIAGNOSIS — H35723 Serous detachment of retinal pigment epithelium, bilateral: Secondary | ICD-10-CM | POA: Diagnosis not present

## 2023-10-05 DIAGNOSIS — Z961 Presence of intraocular lens: Secondary | ICD-10-CM | POA: Diagnosis not present

## 2023-10-05 DIAGNOSIS — H353231 Exudative age-related macular degeneration, bilateral, with active choroidal neovascularization: Secondary | ICD-10-CM | POA: Diagnosis not present

## 2023-10-05 DIAGNOSIS — I10 Essential (primary) hypertension: Secondary | ICD-10-CM

## 2023-10-05 DIAGNOSIS — H353221 Exudative age-related macular degeneration, left eye, with active choroidal neovascularization: Secondary | ICD-10-CM

## 2023-10-05 DIAGNOSIS — H35033 Hypertensive retinopathy, bilateral: Secondary | ICD-10-CM | POA: Diagnosis not present

## 2023-10-07 ENCOUNTER — Encounter (INDEPENDENT_AMBULATORY_CARE_PROVIDER_SITE_OTHER): Payer: Self-pay | Admitting: Ophthalmology

## 2023-11-01 ENCOUNTER — Ambulatory Visit
Admission: RE | Admit: 2023-11-01 | Discharge: 2023-11-01 | Disposition: A | Discharge status: Home / Self Care | Payer: Medicare HMO | Source: Ambulatory Visit | Attending: Family Medicine | Admitting: Family Medicine

## 2023-11-01 DIAGNOSIS — Z1231 Encounter for screening mammogram for malignant neoplasm of breast: Secondary | ICD-10-CM | POA: Diagnosis not present

## 2023-11-29 DIAGNOSIS — I10 Essential (primary) hypertension: Secondary | ICD-10-CM | POA: Diagnosis not present

## 2023-11-29 DIAGNOSIS — Z0000 Encounter for general adult medical examination without abnormal findings: Secondary | ICD-10-CM | POA: Diagnosis not present

## 2023-11-29 DIAGNOSIS — J3089 Other allergic rhinitis: Secondary | ICD-10-CM | POA: Diagnosis not present

## 2023-11-29 DIAGNOSIS — E7800 Pure hypercholesterolemia, unspecified: Secondary | ICD-10-CM | POA: Diagnosis not present

## 2023-11-29 DIAGNOSIS — Z1331 Encounter for screening for depression: Secondary | ICD-10-CM | POA: Diagnosis not present

## 2023-12-16 NOTE — Progress Notes (Addendum)
 [facility] - Clinic Note  12/28/2023     CHIEF COMPLAINT Patient presents for Retina Follow Up   HISTORY OF PRESENT ILLNESS: Marissa Mullen is a 82 y.o. female who presents to the clinic today for:   HPI     Retina Follow Up   Patient presents with  Wet AMD.  In both eyes.  This started 12 weeks ago.  I, the attending physician,  performed the HPI with the patient and updated documentation appropriately.        Comments   Patient here for 12 weeks retina follow up for exu ARMD OU. Patient states vision doing good. No eye pain. Patient mono vision OD near used J Card and OS distant.      Last edited by Valdemar Rogue, MD on 12/29/2023  1:28 AM.      Referring physician: Mevelyn JONETTA Bathe, OD [address] [place],  [place] [address]  HISTORICAL INFORMATION:   Selected notes from the MEDICAL RECORD NUMBER Referred by Dr. Mevelyn for concern of operculated hole LEE:  Ocular Hx- PMH-    CURRENT MEDICATIONS: No current outpatient medications on file. (Ophthalmic Drugs)   No current facility-administered medications for this visit. (Ophthalmic Drugs)   Current Outpatient Medications (Other)  Medication Sig   acetaminophen  (TYLENOL ) 650 MG CR tablet Take 650-1,300 mg by mouth every 8 (eight) hours as needed for pain.    Calcium  Carb-Cholecalciferol  600-800 MG-UNIT TABS Take 2 tablets by mouth daily.   Multiple Vitamins-Minerals (WOMENS MULTIVITAMIN PLUS) TABS Take 1 tablet by mouth daily.   pantoprazole  (PROTONIX ) 40 MG tablet Take 40 mg by mouth daily.   simvastatin  (ZOCOR ) 20 MG tablet Take 20 mg by mouth daily.   No current facility-administered medications for this visit. (Other)   REVIEW OF SYSTEMS: ROS   Positive for: Eyes Negative for: Constitutional, Gastrointestinal, Neurological, Skin, Genitourinary, Musculoskeletal, HENT, Endocrine, Cardiovascular, Respiratory, Psychiatric, Allergic/Imm, Heme/Lymph Last edited by Orval Asberry RAMAN, COA on  12/28/2023  9:49 AM.     ALLERGIES Allergies  Allergen Reactions   Ondansetron      Other reaction(s): Abdominal Pain   Oxycodone  Itching   Tramadol  Itching    Unknown    PAST MEDICAL HISTORY Past Medical History:  Diagnosis Date   Arthritis    Colon polyps    Diverticulosis    Esophageal stricture    GERD (gastroesophageal reflux disease)    Hepatitis    Age 5   Hypercholesterolemia    Hypertension    Past Surgical History:  Procedure Laterality Date   ABDOMINAL HYSTERECTOMY     CATARACT EXTRACTION     COLONOSCOPY WITH PROPOFOL  N/A 03/16/2020   Procedure: COLONOSCOPY WITH PROPOFOL ;  Surgeon: Toledo, Ladell POUR, MD;  Location: [facility];  Service: Gastroenterology;  Laterality: N/A;   COLONOSCOPY WITH PROPOFOL  N/A 09/26/2021   Procedure: COLONOSCOPY WITH PROPOFOL ;  Surgeon: Maryruth Ole DASEN, MD;  Location: [facility];  Service: Endoscopy;  Laterality: N/A;   PARTIAL KNEE ARTHROPLASTY Left 01/23/2016   Procedure: UNICOMPARTMENTAL KNEE;  Surgeon: Norleen JINNY Maltos, MD;  Location: [facility];  Service: Orthopedics;  Laterality: Left;   FAMILY HISTORY Family History  Problem Relation Age of Onset   Diabetes Mother    Diabetes Daughter    Breast cancer Daughter 57   SOCIAL HISTORY Social History   Tobacco Use   Smoking status: Former    Current packs/day: 0.50    Types: Cigarettes   Smokeless tobacco: Never  Vaping  Use   Vaping status: Never Used  Substance Use Topics   Alcohol use: No   Drug use: No       OPHTHALMIC EXAM:  Base Eye Exam     Visual Acuity (Snellen - Linear)       Right Left   Dist [place] 20/20 20/20  Patient mono vision OD near used J Card and OS distant.        Tonometry (Tonopen, 9:46 AM)       Right Left   Pressure 12 12         Pupils       Dark Light Shape React APD   Right 3 2 Round Brisk None   Left 3 2 Round Brisk None         Visual Fields (Counting fingers)       Left Right    Full Full          Extraocular Movement       Right Left    Full, Ortho Full, Ortho         Neuro/Psych     Oriented x3: Yes   Mood/Affect: Normal         Dilation     Both eyes: 1.0% Mydriacyl, 2.5% Phenylephrine  @ 9:46 AM           Slit Lamp and Fundus Exam     Slit Lamp Exam       Right Left   Lids/Lashes Dermatochalasis - upper lid Dermatochalasis - upper lid   Conjunctiva/Sclera White and quiet White and quiet   Cornea tear film debris, well healed cataract wound tear film debris, well healed cataract wound   Anterior Chamber Deep and quiet Deep and quiet   Iris Round and dilated Round and dilated   Lens PC IOL in good position PC IOL in good position   Anterior Vitreous Vitreous syneresis Vitreous syneresis         Fundus Exam       Right Left   Disc Pink and Sharp, Compact Pink and Sharp, Compact   C/D Ratio 0.1 0.1   Macula Flat, Good foveal reflex, RPE mottling, No heme or edema Flat, Good foveal reflex, RPE mottling, No heme or edema   Vessels Tortuous Tortuous   Periphery Attached, focal PED stably improved, no heme, no RT/RD, no new lesions Attached, mild reticular degeneration, stable resolution of 2 focal SRH at 0130 midzone -- stably improved to focal pigment clumps, no heme; no RT/RD, no new lesions, mild reticular degeneration           IMAGING AND PROCEDURES  Imaging and Procedures for 12/28/2023  OCT, Retina - OU - Both Eyes       Right Eye Quality was good. Central Foveal Thickness: 278. Progression has been stable. Findings include normal foveal contour, no IRF, no SRF, vitreomacular adhesion (PED superior periphery -- caught on high density widefield -- stable PED size and stable improvement in SRF on nasal border).   Left Eye Quality was good. Central Foveal Thickness: 314. Progression has been stable. Findings include normal foveal contour, no IRF, no SRF, pigment epithelial detachment, vitreomacular adhesion (Small, focal PED IN to fovea, stable  improvement in two peripheral PED's /SRHM -- ST midzone caught on high density widefield).   Notes *Images captured and stored on drive  Diagnosis / Impression:  NFP; no IRF/SRF centrally OU OD: PED superior periphery -- caught on high density widefield -- stably collapsed and without fluid  OS: Small, focal PED IN to fovea, stable improvement in two peripheral PED's /SRHM -- ST midzone caught on high density widefield  Clinical management:  See below  Abbreviations: NFP - Normal foveal profile. CME - cystoid macular edema. PED - pigment epithelial detachment. IRF - intraretinal fluid. SRF - subretinal fluid. EZ - ellipsoid zone. ERM - epiretinal membrane. ORA - outer retinal atrophy. ORT - outer retinal tubulation. SRHM - subretinal hyper-reflective material. IRHM - intraretinal hyper-reflective material            ASSESSMENT/PLAN:    ICD-10-CM   1. Exudative age-related macular degeneration of right eye with active choroidal neovascularization (HCC)  H35.3211 OCT, Retina - OU - Both Eyes    2. Exudative age-related macular degeneration of left eye with active choroidal neovascularization (HCC)  H35.3221     3. 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 Essential hypertension  I10     5. Hypertensive retinopathy of both eyes  H35.033     6. Pseudophakia, both eyes  Z96.1       1.  Exudative age related macular degeneration, OD - s/p IVA OD #1 (09.15.23), #2 (10.16.23), #3 (11.14.23), #4 (12.12.23), #5 (01.16.24), #6 (02.27.24) - interval increase in peripheral PED size and development of focal SRF superior periphery on 09.11.23 visit  - BCVA 20/20 -- stable - OCT shows OD: PED superior periphery -- caught on high density widefield -- stably collapsed and without fluid at 10+ months since last injxn  - exam without active heme or fluid surrounding PED / CNV  - recommend holding injection OD again today - pt in agreement - IVA informed consent  obtained and signed, 01.16.24 (OU)  - f/u in 4-6 mos -- DFE/OCT, possible injection  2. Exudative age related macular degeneration, left eye  - s/p IVA OS #1 (01.16.24) for focal peripheral subretinal hemorrhage, #2 (02.27.24), #3 (04.09.24), #4 (05.21.24), #5 (07.16.24) - exam shows stable improvement in subretinal hemorrhage superior periphery adjacent to focal PED -- turning white and shrinking -- essentially resolved - OCT shows Small, focal PED IN to fovea, stable improvement in two peripheral PED's /SRHM -- ST midzone caught on high density widefield at 5 months at since last injection  - recommend holding injection OS today  - pt in agreement  - RBA of procedure discussed, questions answered - informed consent obtained and signed - see procedure note - f/u in 4-6 mos, DFE, OCT, possible injxn  3. PED OU  - exam shows focal PEDs w/ +subretinal heme OU  - OD 1200 equator  - OS 0130 midzone - OCT shows OD: PED superior periphery -- caught on high density widefield -- stably collapsed and without fluid; OS: Small, focal PED IN to fovea, stable improvement in two peripheral PED's /SRHM--ST midzone caught on high density widefield  - BCVA 20/20 OU -- asymptomatic  - no retinal tear or retinal detachment  - discussed findings, prognosis - unclear etiology of focal PEDs -- differential includes ARMD variant, PEHCR, PCV  - f/u 4-6 mos DFE, high density OCT (OD 1200, OS 0130)  4,5. Hypertensive retinopathy OU - discussed importance of tight BP control - monitor  6. Pseudophakia OU  - s/p CE/IOL OU  - IOLs in good position  - monitor  Ophthalmic Meds Ordered this visit:  No orders of the defined types were placed in this encounter.    Return for f/u 4-77months, exu ARMD OU, DFE, OCT.  There  are no Patient Instructions on file for this visit.   Explained the diagnoses, plan, and follow up with the patient and they expressed understanding.  Patient expressed understanding of the  importance of proper follow up care.   This document serves as a record of services personally performed by Redell JUDITHANN Hans, MD, PhD. It was created on their behalf by Wanda GEANNIE Keens, COT an ophthalmic technician. The creation of this record is the provider's dictation and/or activities during the visit.    Electronically signed by:  Wanda GEANNIE Keens, COT  12/29/23 1:47 AM  This document serves as a record of services personally performed by Redell JUDITHANN Hans, MD, PhD. It was created on their behalf by Alan PARAS. Delores, OA an ophthalmic technician. The creation of this record is the provider's dictation and/or activities during the visit.    Electronically signed by: Alan PARAS. Delores, OA 12/29/23 1:47 AM  Redell JUDITHANN Hans, M.D., Ph.D. Diseases & Surgery of the Retina and Vitreous [facility]  I have reviewed the above documentation for accuracy and completeness, and I agree with the above. Redell JUDITHANN Hans, M.D., Ph.D. 12/29/23 1:47 AM   Abbreviations: M myopia (nearsighted); A astigmatism; H hyperopia (farsighted); P presbyopia; Mrx spectacle prescription;  CTL contact lenses; OD right eye; OS left eye; OU both eyes  XT exotropia; ET esotropia; PEK punctate epithelial keratitis; PEE punctate epithelial erosions; DES dry eye syndrome; MGD meibomian gland dysfunction; ATs artificial tears; PFAT's preservative free artificial tears; NSC nuclear sclerotic cataract; PSC posterior subcapsular cataract; ERM epi-retinal membrane; PVD posterior vitreous detachment; RD retinal detachment; DM diabetes mellitus; DR diabetic retinopathy; NPDR non-proliferative diabetic retinopathy; PDR proliferative diabetic retinopathy; CSME clinically significant macular edema; DME diabetic macular edema; dbh dot blot hemorrhages; CWS cotton wool spot; POAG primary open angle glaucoma; C/D cup-to-disc ratio; HVF humphrey visual field; GVF goldmann visual field; OCT optical coherence tomography; IOP  intraocular pressure; BRVO Branch retinal vein occlusion; CRVO central retinal vein occlusion; CRAO central retinal artery occlusion; BRAO branch retinal artery occlusion; RT retinal tear; SB scleral buckle; PPV pars plana vitrectomy; VH Vitreous hemorrhage; PRP panretinal laser photocoagulation; IVK intravitreal kenalog; VMT vitreomacular traction; MH Macular hole;  NVD neovascularization of the disc; NVE neovascularization elsewhere; AREDS age related eye disease study; ARMD age related macular degeneration; POAG primary open angle glaucoma; EBMD epithelial/anterior basement membrane dystrophy; ACIOL anterior chamber intraocular lens; IOL intraocular lens; PCIOL posterior chamber intraocular lens; Phaco/IOL phacoemulsification with intraocular lens placement; PRK photorefractive keratectomy; LASIK laser assisted in situ keratomileusis; HTN hypertension; DM diabetes mellitus; COPD chronic obstructive pulmonary disease

## 2023-12-28 ENCOUNTER — Encounter (INDEPENDENT_AMBULATORY_CARE_PROVIDER_SITE_OTHER): Payer: Self-pay | Admitting: Ophthalmology

## 2023-12-28 ENCOUNTER — Ambulatory Visit (INDEPENDENT_AMBULATORY_CARE_PROVIDER_SITE_OTHER): Payer: Medicare HMO | Admitting: Ophthalmology

## 2023-12-28 DIAGNOSIS — H353221 Exudative age-related macular degeneration, left eye, with active choroidal neovascularization: Secondary | ICD-10-CM

## 2023-12-28 DIAGNOSIS — H35033 Hypertensive retinopathy, bilateral: Secondary | ICD-10-CM | POA: Diagnosis not present

## 2023-12-28 DIAGNOSIS — I10 Essential (primary) hypertension: Secondary | ICD-10-CM

## 2023-12-28 DIAGNOSIS — Z961 Presence of intraocular lens: Secondary | ICD-10-CM | POA: Diagnosis not present

## 2023-12-28 DIAGNOSIS — H35723 Serous detachment of retinal pigment epithelium, bilateral: Secondary | ICD-10-CM

## 2023-12-28 DIAGNOSIS — H353231 Exudative age-related macular degeneration, bilateral, with active choroidal neovascularization: Secondary | ICD-10-CM

## 2023-12-28 DIAGNOSIS — H353211 Exudative age-related macular degeneration, right eye, with active choroidal neovascularization: Secondary | ICD-10-CM

## 2023-12-29 ENCOUNTER — Encounter (INDEPENDENT_AMBULATORY_CARE_PROVIDER_SITE_OTHER): Payer: Self-pay | Admitting: Ophthalmology

## 2024-01-09 DIAGNOSIS — Z008 Encounter for other general examination: Secondary | ICD-10-CM | POA: Diagnosis not present

## 2024-07-03 NOTE — Progress Notes (Signed)
 [facility] - Clinic Note  07/04/2024     CHIEF COMPLAINT Patient presents for Retina Follow Up   HISTORY OF PRESENT ILLNESS: Marissa Mullen is a 83 y.o. female who presents to the clinic today for:   HPI     Retina Follow Up   Patient presents with  Dry AMD.  In both eyes.  This started 7 months ago.  Duration of 7 months.  Since onset it is stable.  I, the attending physician,  performed the HPI with the patient and updated documentation appropriately.        Comments   7 month retina follow up ARMD OU pt is reporting no vision changes noticed she denies any flashes or floaters       Last edited by Valdemar Rogue, MD on 07/04/2024  9:18 PM.       Referring physician: Mevelyn JONETTA Bathe, OD [address] [place],  [place] [address]  HISTORICAL INFORMATION:   Selected notes from the MEDICAL RECORD NUMBER Referred by Dr. Mevelyn for concern of operculated hole LEE:  Ocular Hx- PMH-    CURRENT MEDICATIONS: No current outpatient medications on file. (Ophthalmic Drugs)   No current facility-administered medications for this visit. (Ophthalmic Drugs)   Current Outpatient Medications (Other)  Medication Sig   acetaminophen  (TYLENOL ) 650 MG CR tablet Take 650-1,300 mg by mouth every 8 (eight) hours as needed for pain.    Calcium  Carb-Cholecalciferol  600-800 MG-UNIT TABS Take 2 tablets by mouth daily.   Multiple Vitamins-Minerals (WOMENS MULTIVITAMIN PLUS) TABS Take 1 tablet by mouth daily.   pantoprazole  (PROTONIX ) 40 MG tablet Take 40 mg by mouth daily.   simvastatin  (ZOCOR ) 20 MG tablet Take 20 mg by mouth daily.   No current facility-administered medications for this visit. (Other)   REVIEW OF SYSTEMS: ROS   Positive for: Eyes Negative for: Constitutional, Gastrointestinal, Neurological, Skin, Genitourinary, Musculoskeletal, HENT, Endocrine, Cardiovascular, Respiratory, Psychiatric, Allergic/Imm, Heme/Lymph Last edited by Resa Delon ORN, COT  on 07/04/2024  9:13 AM.      ALLERGIES Allergies  Allergen Reactions   Ondansetron      Other reaction(s): Abdominal Pain   Oxycodone  Itching   Tramadol  Itching    Unknown    PAST MEDICAL HISTORY Past Medical History:  Diagnosis Date   Arthritis    Colon polyps    Diverticulosis    Esophageal stricture    GERD (gastroesophageal reflux disease)    Hepatitis    Age 14   Hypercholesterolemia    Hypertension    Past Surgical History:  Procedure Laterality Date   ABDOMINAL HYSTERECTOMY     CATARACT EXTRACTION     COLONOSCOPY WITH PROPOFOL  N/A 03/16/2020   Procedure: COLONOSCOPY WITH PROPOFOL ;  Surgeon: Toledo, Ladell POUR, MD;  Location: [facility];  Service: Gastroenterology;  Laterality: N/A;   COLONOSCOPY WITH PROPOFOL  N/A 09/26/2021   Procedure: COLONOSCOPY WITH PROPOFOL ;  Surgeon: Maryruth Ole DASEN, MD;  Location: [facility];  Service: Endoscopy;  Laterality: N/A;   PARTIAL KNEE ARTHROPLASTY Left 01/23/2016   Procedure: UNICOMPARTMENTAL KNEE;  Surgeon: Norleen JINNY Maltos, MD;  Location: [facility];  Service: Orthopedics;  Laterality: Left;   FAMILY HISTORY Family History  Problem Relation Age of Onset   Diabetes Mother    Diabetes Daughter    Breast cancer Daughter 37   SOCIAL HISTORY Social History   Tobacco Use   Smoking status: Former    Current packs/day: 0.50    Types: Cigarettes   Smokeless tobacco:  Never  Vaping Use   Vaping status: Never Used  Substance Use Topics   Alcohol use: No   Drug use: No       OPHTHALMIC EXAM:  Base Eye Exam     Visual Acuity (Snellen - Linear)       Right Left   Dist [place] 20/20 -3 20/20         Tonometry (1Tonopen, 9:17 AM)       Right Left   Pressure 11 11         Pupils       Pupils Dark Light Shape React APD   Right PERRL 3 2 Round Brisk None   Left PERRL 3 2 Round Brisk None         Visual Fields       Left Right    Full Full         Extraocular Movement       Right Left    Full,  Ortho Full, Ortho         Neuro/Psych     Oriented x3: Yes   Mood/Affect: Normal         Dilation     Both eyes: 2.5% Phenylephrine  @ 9:17 AM           Slit Lamp and Fundus Exam     Slit Lamp Exam       Right Left   Lids/Lashes Dermatochalasis - upper lid Dermatochalasis - upper lid   Conjunctiva/Sclera White and quiet White and quiet   Cornea tear film debris, well healed cataract wound tear film debris, well healed cataract wound   Anterior Chamber Deep and quiet Deep and quiet   Iris Round and dilated Round and dilated   Lens PC IOL in good position PC IOL in good position   Anterior Vitreous Vitreous syneresis Vitreous syneresis         Fundus Exam       Right Left   Disc Pink and Sharp, Compact Pink and Sharp, Compact   C/D Ratio 0.1 0.1   Macula Flat, Good foveal reflex, RPE mottling, No heme or edema Flat, Good foveal reflex, RPE mottling, No heme or edema   Vessels Tortuous attenuated, Tortuous   Periphery Attached, focal PED stably improved, no heme, no RT/RD, no new PED / CNV Attached, mild reticular degeneration, stable resolution of 2 focal SRH at 0130 midzone -- stably improved to focal pigment clumps, no heme; no RT/RD, no new PED / CNV, mild reticular degeneration           IMAGING AND PROCEDURES  Imaging and Procedures for 07/04/2024  OCT, Retina - OU - Both Eyes       Right Eye Quality was good. Central Foveal Thickness: 277. Progression has been stable. Findings include normal foveal contour, no IRF, no SRF, vitreomacular adhesion (PED superior periphery -- caught on high density widefield -- stable PED size and stable improvement in SRF on nasal border).   Left Eye Quality was good. Central Foveal Thickness: 319. Progression has been stable. Findings include normal foveal contour, no IRF, no SRF, pigment epithelial detachment, vitreomacular adhesion (Small, focal PED IN to fovea, stable improvement in two peripheral PED's /SRHM -- ST  midzone caught on high density widefield).   Notes *Images captured and stored on drive  Diagnosis / Impression:  NFP; no IRF/SRF centrally OU OD: PED superior periphery -- caught on high density widefield -- stably collapsed and without fluid OS: Small, focal  PED IN to fovea, stable improvement in two peripheral PED's /SRHM -- ST midzone caught on high density widefield  Clinical management:  See below  Abbreviations: NFP - Normal foveal profile. CME - cystoid macular edema. PED - pigment epithelial detachment. IRF - intraretinal fluid. SRF - subretinal fluid. EZ - ellipsoid zone. ERM - epiretinal membrane. ORA - outer retinal atrophy. ORT - outer retinal tubulation. SRHM - subretinal hyper-reflective material. IRHM - intraretinal hyper-reflective material            ASSESSMENT/PLAN:    ICD-10-CM   1. Exudative age-related macular degeneration of right eye with active choroidal neovascularization (HCC)  H35.3211 OCT, Retina - OU - Both Eyes    2. Exudative age-related macular degeneration of left eye with active choroidal neovascularization (HCC)  H35.3221 OCT, Retina - OU - Both Eyes    3. 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 Essential hypertension  I10     5. Hypertensive retinopathy of both eyes  H35.033     6. Pseudophakia, both eyes  Z96.1      1.  Exudative age related macular degeneration, OD - s/p IVA OD #1 (09.15.23), #2 (10.16.23), #3 (11.14.23), #4 (12.12.23), #5 (01.16.24), #6 (02.27.24) - interval increase in peripheral PED size and development of focal SRF superior periphery on 09.11.23 visit  - BCVA 20/20 -- stable - OCT shows OD: PED superior periphery -- caught on high density widefield -- stably collapsed and without fluid at 16+ months since last injxn  - exam without active heme or fluid surrounding PED / CNV  - recommend holding injection OD again today - pt in agreement - IVA informed consent obtained and signed,  01.16.24 (OU)  - f/u in 9-12 mos -- DFE/OCT, possible injection  2. Exudative age related macular degeneration, left eye  - s/p IVA OS #1 (01.16.24) for focal peripheral subretinal hemorrhage, #2 (02.27.24), #3 (04.09.24), #4 (05.21.24), #5 (07.16.24) - exam shows stable improvement in subretinal hemorrhage superior periphery adjacent to focal PED -- resolved - OCT shows Small, focal PED IN to fovea, stable improvement in two peripheral PED's /SRHM -- ST midzone caught on high density widefield at 12 months at since last injection  - recommend holding injection OS today  - pt in agreement  - RBA of procedure discussed, questions answered - informed consent obtained and signed - see procedure note - f/u in 9-12 mos, DFE, OCT, possible injxn  3. PED OU  - exam shows focal PEDs w/ +subretinal heme OU  - OD 1200 equator  - OS 0130 midzone - OCT shows OD: PED superior periphery -- caught on high density widefield -- stably collapsed and without fluid; OS: Small, focal PED IN to fovea, stable improvement in two peripheral PED's /SRHM--ST midzone caught on high density widefield  - BCVA 20/20 OU -- asymptomatic  - no retinal tear or retinal detachment  - discussed findings, prognosis - unclear etiology of focal PEDs -- differential includes ARMD variant, PEHCR, PCV  - f/u 9-12 mos DFE, high density OCT (OD 1200, OS 0130)  4,5. Hypertensive retinopathy OU - discussed importance of tight BP control - monitor  6. Pseudophakia OU  - s/p CE/IOL OU  - IOLs in good position  - monitor  Ophthalmic Meds Ordered this visit:  No orders of the defined types were placed in this encounter.    Return for f/u 9-12 months, exu ARMD OU, DFE, OCT.  There are no Patient  Instructions on file for this visit.   Explained the diagnoses, plan, and follow up with the patient and they expressed understanding.  Patient expressed understanding of the importance of proper follow up care.   This document  serves as a record of services personally performed by Redell JUDITHANN Hans, MD, PhD. It was created on their behalf by Auston Muzzy, COMT. The creation of this record is the provider's dictation and/or activities during the visit.  Electronically signed by: Auston Muzzy, COMT 07/04/24 9:19 PM  This document serves as a record of services personally performed by Redell JUDITHANN Hans, MD, PhD. It was created on their behalf by Alan PARAS. Delores, OA an ophthalmic technician. The creation of this record is the provider's dictation and/or activities during the visit.    Electronically signed by: Alan PARAS. Delores, OA 07/04/24 9:19 PM  Redell JUDITHANN Hans, M.D., Ph.D. Diseases & Surgery of the Retina and Vitreous [facility]  I have reviewed the above documentation for accuracy and completeness, and I agree with the above. Redell JUDITHANN Hans, M.D., Ph.D. 07/04/24 9:22 PM   Abbreviations: M myopia (nearsighted); A astigmatism; H hyperopia (farsighted); P presbyopia; Mrx spectacle prescription;  CTL contact lenses; OD right eye; OS left eye; OU both eyes  XT exotropia; ET esotropia; PEK punctate epithelial keratitis; PEE punctate epithelial erosions; DES dry eye syndrome; MGD meibomian gland dysfunction; ATs artificial tears; PFAT's preservative free artificial tears; NSC nuclear sclerotic cataract; PSC posterior subcapsular cataract; ERM epi-retinal membrane; PVD posterior vitreous detachment; RD retinal detachment; DM diabetes mellitus; DR diabetic retinopathy; NPDR non-proliferative diabetic retinopathy; PDR proliferative diabetic retinopathy; CSME clinically significant macular edema; DME diabetic macular edema; dbh dot blot hemorrhages; CWS cotton wool spot; POAG primary open angle glaucoma; C/D cup-to-disc ratio; HVF humphrey visual field; GVF goldmann visual field; OCT optical coherence tomography; IOP intraocular pressure; BRVO Branch retinal vein occlusion; CRVO central retinal vein occlusion;  CRAO central retinal artery occlusion; BRAO branch retinal artery occlusion; RT retinal tear; SB scleral buckle; PPV pars plana vitrectomy; VH Vitreous hemorrhage; PRP panretinal laser photocoagulation; IVK intravitreal kenalog; VMT vitreomacular traction; MH Macular hole;  NVD neovascularization of the disc; NVE neovascularization elsewhere; AREDS age related eye disease study; ARMD age related macular degeneration; POAG primary open angle glaucoma; EBMD epithelial/anterior basement membrane dystrophy; ACIOL anterior chamber intraocular lens; IOL intraocular lens; PCIOL posterior chamber intraocular lens; Phaco/IOL phacoemulsification with intraocular lens placement; PRK photorefractive keratectomy; LASIK laser assisted in situ keratomileusis; HTN hypertension; DM diabetes mellitus; COPD chronic obstructive pulmonary disease

## 2024-07-04 ENCOUNTER — Encounter (INDEPENDENT_AMBULATORY_CARE_PROVIDER_SITE_OTHER): Payer: Self-pay | Admitting: Ophthalmology

## 2024-07-04 ENCOUNTER — Ambulatory Visit (INDEPENDENT_AMBULATORY_CARE_PROVIDER_SITE_OTHER): Payer: Medicare HMO | Admitting: Ophthalmology

## 2024-07-04 DIAGNOSIS — H353231 Exudative age-related macular degeneration, bilateral, with active choroidal neovascularization: Secondary | ICD-10-CM | POA: Diagnosis not present

## 2024-07-04 DIAGNOSIS — H353221 Exudative age-related macular degeneration, left eye, with active choroidal neovascularization: Secondary | ICD-10-CM

## 2024-07-04 DIAGNOSIS — H35723 Serous detachment of retinal pigment epithelium, bilateral: Secondary | ICD-10-CM | POA: Diagnosis not present

## 2024-07-04 DIAGNOSIS — Z961 Presence of intraocular lens: Secondary | ICD-10-CM

## 2024-07-04 DIAGNOSIS — H35033 Hypertensive retinopathy, bilateral: Secondary | ICD-10-CM | POA: Diagnosis not present

## 2024-07-04 DIAGNOSIS — H353211 Exudative age-related macular degeneration, right eye, with active choroidal neovascularization: Secondary | ICD-10-CM

## 2024-07-04 DIAGNOSIS — I10 Essential (primary) hypertension: Secondary | ICD-10-CM | POA: Diagnosis not present

## 2024-08-09 DIAGNOSIS — H1045 Other chronic allergic conjunctivitis: Secondary | ICD-10-CM | POA: Diagnosis not present

## 2024-08-09 DIAGNOSIS — H33322 Round hole, left eye: Secondary | ICD-10-CM | POA: Diagnosis not present

## 2024-08-09 DIAGNOSIS — H353121 Nonexudative age-related macular degeneration, left eye, early dry stage: Secondary | ICD-10-CM | POA: Diagnosis not present

## 2024-08-09 DIAGNOSIS — H26493 Other secondary cataract, bilateral: Secondary | ICD-10-CM | POA: Diagnosis not present

## 2024-09-29 ENCOUNTER — Other Ambulatory Visit: Payer: Self-pay | Admitting: Family Medicine

## 2024-09-29 DIAGNOSIS — Z1231 Encounter for screening mammogram for malignant neoplasm of breast: Secondary | ICD-10-CM

## 2024-11-01 ENCOUNTER — Ambulatory Visit
Admission: RE | Admit: 2024-11-01 | Discharge: 2024-11-01 | Disposition: A | Discharge status: Home / Self Care | Source: Ambulatory Visit | Attending: Family Medicine | Admitting: Family Medicine

## 2024-11-01 DIAGNOSIS — Z1231 Encounter for screening mammogram for malignant neoplasm of breast: Secondary | ICD-10-CM | POA: Diagnosis not present

## 2025-06-19 ENCOUNTER — Encounter (INDEPENDENT_AMBULATORY_CARE_PROVIDER_SITE_OTHER): Admitting: Ophthalmology
